# Patient Record
Sex: Female | Born: 1995 | Hispanic: Yes | Marital: Single | Smoking: Former smoker
Health system: Southern US, Community
[De-identification: ages and names within clinical notes are randomized; demographics above are authoritative.]

## PROBLEM LIST (undated history)

## (undated) DIAGNOSIS — E7800 Pure hypercholesterolemia, unspecified: Secondary | ICD-10-CM

## (undated) DIAGNOSIS — N920 Excessive and frequent menstruation with regular cycle: Secondary | ICD-10-CM

## (undated) HISTORY — DX: Excessive and frequent menstruation with regular cycle: N92.0

## (undated) HISTORY — DX: Pure hypercholesterolemia, unspecified: E78.00

## (undated) HISTORY — PX: LAPAROSCOPIC GASTRIC BAND REMOVAL WITH LAPAROSCOPIC GASTRIC SLEEVE RESECTION: SHX6498

---

## 2017-06-27 LAB — HM PAP SMEAR: HM Pap smear: NEGATIVE

## 2017-06-27 LAB — HM HIV SCREENING LAB: HM HIV Screening: NEGATIVE

## 2018-06-13 ENCOUNTER — Ambulatory Visit: Payer: Self-pay | Admitting: Obstetrics and Gynecology

## 2018-06-13 NOTE — Progress Notes (Deleted)
   PCP:  No primary care provider on file.   No chief complaint on file.    HPI:      Gwendolyn Rogers is a 23 y.o. No obstetric history on file. who LMP was No LMP recorded., presents today for her NP annual examination.  Her menses are {norm/abn:715}, lasting {number:22536} days.  Dysmenorrhea {dysmen:716}. She {does:18564} have intermenstrual bleeding.  Sex activity: {sex active:315163}.  Last Pap: {ZOXW:[id number]}{date:[id number]}  Results were: {norm/abn:16707::"no abnormalities"} /neg HPV DNA *** Hx of STDs: {STD hx:14358}  There is no FH of breast cancer. There is no FH of ovarian cancer. The patient {does:18564} do self-breast exams.  Tobacco use: {tob:20664} Alcohol use: {Alcohol:11675} No drug use.  Exercise: {exercise:31265}  She {does:18564} get adequate calcium and Vitamin D in her diet.   No past medical history on file.  *** The histories are not reviewed yet. Please review them in the "History" navigator section and refresh this SmartLink.  No family history on file.  Social History   Socioeconomic History  . Marital status: Single    Spouse name: Not on file  . Number of children: Not on file  . Years of education: Not on file  . Highest education level: Not on file  Occupational History  . Not on file  Social Needs  . Financial resource strain: Not on file  . Food insecurity:    Worry: Not on file    Inability: Not on file  . Transportation needs:    Medical: Not on file    Non-medical: Not on file  Tobacco Use  . Smoking status: Not on file  Substance and Sexual Activity  . Alcohol use: Not on file  . Drug use: Not on file  . Sexual activity: Not on file  Lifestyle  . Physical activity:    Days per week: Not on file    Minutes per session: Not on file  . Stress: Not on file  Relationships  . Social connections:    Talks on phone: Not on file    Gets together: Not on file    Attends religious service: Not on file    Active member of club or  organization: Not on file    Attends meetings of clubs or organizations: Not on file    Relationship status: Not on file  . Intimate partner violence:    Fear of current or ex partner: Not on file    Emotionally abused: Not on file    Physically abused: Not on file    Forced sexual activity: Not on file  Other Topics Concern  . Not on file  Social History Narrative  . Not on file    No outpatient medications prior to visit.   No facility-administered medications prior to visit.       ROS:  Review of Systems BREAST: No symptoms   Objective: There were no vitals taken for this visit.   OBGyn Exam  Results: No results found for this or any previous visit (from the past 24 hour(s)).  Assessment/Plan: No diagnosis found.  No orders of the defined types were placed in this encounter.            GYN counsel {counseling:16159}     F/U  No follow-ups on file.  Alicia B. Copland, PA-C 06/13/2018 9:03 AM

## 2018-08-03 ENCOUNTER — Ambulatory Visit (INDEPENDENT_AMBULATORY_CARE_PROVIDER_SITE_OTHER): Payer: BLUE CROSS/BLUE SHIELD | Admitting: Certified Nurse Midwife

## 2018-08-03 ENCOUNTER — Other Ambulatory Visit (HOSPITAL_COMMUNITY)
Admission: RE | Admit: 2018-08-03 | Discharge: 2018-08-03 | Disposition: A | Discharge status: Home / Self Care | Payer: Self-pay | Source: Ambulatory Visit | Attending: Certified Nurse Midwife | Admitting: Certified Nurse Midwife

## 2018-08-03 ENCOUNTER — Encounter: Payer: Self-pay | Admitting: Certified Nurse Midwife

## 2018-08-03 VITALS — BP 94/65 | HR 72 | Ht 61.0 in | Wt 166.0 lb

## 2018-08-03 DIAGNOSIS — Z01419 Encounter for gynecological examination (general) (routine) without abnormal findings: Secondary | ICD-10-CM | POA: Insufficient documentation

## 2018-08-03 DIAGNOSIS — Z6831 Body mass index (BMI) 31.0-31.9, adult: Secondary | ICD-10-CM

## 2018-08-03 DIAGNOSIS — Z113 Encounter for screening for infections with a predominantly sexual mode of transmission: Secondary | ICD-10-CM | POA: Insufficient documentation

## 2018-08-03 DIAGNOSIS — N920 Excessive and frequent menstruation with regular cycle: Secondary | ICD-10-CM

## 2018-08-03 DIAGNOSIS — O26899 Other specified pregnancy related conditions, unspecified trimester: Secondary | ICD-10-CM | POA: Insufficient documentation

## 2018-08-03 DIAGNOSIS — R102 Pelvic and perineal pain: Secondary | ICD-10-CM

## 2018-08-03 DIAGNOSIS — Z87440 Personal history of urinary (tract) infections: Secondary | ICD-10-CM

## 2018-08-03 DIAGNOSIS — Z9884 Bariatric surgery status: Secondary | ICD-10-CM

## 2018-08-03 DIAGNOSIS — Z3041 Encounter for surveillance of contraceptive pills: Secondary | ICD-10-CM

## 2018-08-03 NOTE — Patient Instructions (Signed)
WE WOULD LOVE TO HEAR FROM YOU!!!!   Thank you Andria Rhein for visiting [facility].  Providing our patients with the best experience possible is really important to [place], and we hope that you felt that on your recent visit. The most valuable feedback we get comes from [place]!!    If you receive a survey please take a couple of minutes to let us know how we did.Thank you for continuing to trust [place] with your care.   [facility]   Ethinyl Estradiol; Norgestimate tablets What is this medicine? ETHINYL ESTRADIOL; NORGESTIMATE (ETH in il es tra DYE ole; nor JES ti mate) is an oral contraceptive. The products combine two types of female hormones, an estrogen and a progestin. They are used to prevent ovulation and pregnancy. Some products are also used to treat acne in females. This medicine may be used for other purposes; ask your health care provider or pharmacist if you have questions. COMMON BRAND NAME(S): Estarylla, Mili, MONO-LINYAH, MonoNessa, Norgestimate/Ethinyl Estradiol, Ortho Tri-Cyclen, Ortho Tri-Cyclen Lo, Ortho-Cyclen, Previfem, Sprintec, Tri-Estarylla, TRI-LINYAH, Tri-Lo-Estarylla, Tri-Lo-Marzia, Tri-Lo-Mili, Tri-Lo-Sprintec, Tri-Mili, Tri-Previfem, Tri-Sprintec, Tri-VyLibra, Trinessa, Trinessa Lo, VyLibra What should I tell my health care provider before I take this medicine? They need to know if you have or ever had any of these conditions: -abnormal vaginal bleeding -blood vessel disease or blood clots -breast, cervical, endometrial, ovarian, liver, or uterine cancer -diabetes -gallbladder disease -heart disease or recent heart attack -high blood pressure -high cholesterol -kidney disease -liver disease -migraine headaches -stroke -systemic lupus erythematosus (SLE) -tobacco smoker -an unusual or allergic reaction to estrogens, progestins, other medicines, foods, dyes, or preservatives -pregnant or trying to get  pregnant -breast-feeding How should I use this medicine? Take this medicine by mouth. To reduce nausea, this medicine may be taken with food. Follow the directions on the prescription label. Take this medicine at the same time each day and in the order directed on the package. Do not take your medicine more often than directed. Contact your pediatrician regarding the use of this medicine in children. Special care may be needed. This medicine has been used in female children who have started having menstrual periods. A patient package insert for the product will be given with each prescription and refill. Read this sheet carefully each time. The sheet may change frequently. Overdosage: If you think you have taken too much of this medicine contact a poison control center or emergency room at once. NOTE: This medicine is only for you. Do not share this medicine with others. What if I miss a dose? If you miss a dose, refer to the patient information sheet you received with your medicine for direction. If you miss more than one pill, this medicine may not be as effective and you may need to use another form of birth control. What may interact with this medicine? Do not take this medicine with the following medication: -dasabuvir; ombitasvir; paritaprevir; ritonavir -ombitasvir; paritaprevir; ritonavir This medicine may also interact with the following medications: -acetaminophen -antibiotics or medicines for infections, especially rifampin, rifabutin, rifapentine, and griseofulvin, and possibly penicillins or tetracyclines -aprepitant -ascorbic acid (vitamin C) -atorvastatin -barbiturate medicines, such as phenobarbital -bosentan -carbamazepine -caffeine -clofibrate -cyclosporine -dantrolene -doxercalciferol -felbamate -grapefruit juice -hydrocortisone -medicines for anxiety or sleeping problems, such as diazepam or temazepam -medicines for diabetes, including pioglitazone -mineral  oil -modafinil -mycophenolate -nefazodone -oxcarbazepine -phenytoin -prednisolone -ritonavir or other medicines for HIV infection or AIDS -rosuvastatin -selegiline -soy isoflavones supplements -St. John's wort -tamoxifen or raloxifene -theophylline -  thyroid hormones -topiramate -warfarin This list may not describe all possible interactions. Give your health care provider a list of all the medicines, herbs, non-prescription drugs, or dietary supplements you use. Also tell them if you smoke, drink alcohol, or use illegal drugs. Some items may interact with your medicine. What should I watch for while using this medicine? Visit your doctor or health care professional for regular checks on your progress. You will need a regular breast and pelvic exam and Pap smear while on this medicine. You should also discuss the need for regular mammograms with your health care professional, and follow his or her guidelines for these tests. This medicine can make your body retain fluid, making your fingers, hands, or ankles swell. Your blood pressure can go up. Contact your doctor or health care professional if you feel you are retaining fluid. Use an additional method of contraception during the first cycle that you take these tablets. If you have any reason to think you are pregnant, stop taking this medicine right away and contact your doctor or health care professional. If you are taking this medicine for hormone related problems, it may take several cycles of use to see improvement in your condition. Do not use this product if you smoke and are over 27 years of age. Smoking increases the risk of getting a blood clot or having a stroke while you are taking birth control pills, especially if you are more than 23 years old. If you are a smoker who is 56 years of age or younger, you are strongly advised not to smoke while taking birth control pills. This medicine can make you more sensitive to the sun. Keep  out of the sun. If you cannot avoid being in the sun, wear protective clothing and use sunscreen. Do not use sun lamps or tanning beds/booths. If you wear contact lenses and notice visual changes, or if the lenses begin to feel uncomfortable, consult your eye care specialist. In some women, tenderness, swelling, or minor bleeding of the gums may occur. Notify your dentist if this happens. Brushing and flossing your teeth regularly may help limit this. See your dentist regularly and inform your dentist of the medicines you are taking. If you are going to have elective surgery, you may need to stop taking this medicine before the surgery. Consult your health care professional for advice. This medicine does not protect you against HIV infection (AIDS) or any other sexually transmitted diseases. What side effects may I notice from receiving this medicine? Side effects that you should report to your doctor or health care professional as soon as possible: -breast tissue changes or discharge -changes in vaginal bleeding during your period or between your periods -chest pain -coughing up blood -dizziness or fainting spells -headaches or migraines -leg, arm or groin pain -severe or sudden headaches -stomach pain (severe) -sudden shortness of breath -sudden loss of coordination, especially on one side of the body -speech problems -symptoms of vaginal infection like itching, irritation or unusual discharge -tenderness in the upper abdomen -vomiting -weakness or numbness in the arms or legs, especially on one side of the body -yellowing of the eyes or skin Side effects that usually do not require medical attention (report to your doctor or health care professional if they continue or are bothersome): -breakthrough bleeding and spotting that continues beyond the 3 initial cycles of pills -breast tenderness -mood changes, anxiety, depression, frustration, anger, or emotional outbursts -increased  sensitivity to sun or ultraviolet light -nausea -skin  rash, acne, or brown spots on the skin -weight gain (slight) This list may not describe all possible side effects. Call your doctor for medical advice about side effects. You may report side effects to FDA at 1-800-FDA-1088. Where should I keep my medicine? Keep out of the reach of children. Store at room temperature between 15 and 30 degrees C (59 and 86 degrees F). Throw away any unused medicine after the expiration date. NOTE: This sheet is a summary. It may not cover all possible information. If you have questions about this medicine, talk to your doctor, pharmacist, or health care provider.  2019 Elsevier/Gold Standard (2016-01-18 08:09:09)   Menorrhagia Menorrhagia is when your menstrual periods are heavy or last longer than normal. Follow these instructions at home: Medicines   Take over-the-counter and prescription medicines exactly as told by your doctor. This includes iron pills.  Do not change or switch medicines without asking your doctor.  Do not take aspirin or medicines that contain aspirin 1 week before or during your period. Aspirin may make bleeding worse. General instructions  If you need to change your pad or tampon more than once every 2 hours, limit your activity until the bleeding stops.  Iron pills can cause problems when pooping (constipation). To prevent or treat pooping problems while taking prescription iron pills, your doctor may suggest that you: ? Drink enough fluid to keep your pee (urine) clear or pale yellow. ? Take over-the-counter or prescription medicines. ? Eat foods that are high in fiber. These foods include: ? Fresh fruits and vegetables. ? Whole grains. ? Beans. ? Limit foods that are high in fat and processed sugars. This includes fried and sweet foods.  Eat healthy meals and foods that are high in iron. Foods that have a lot of iron include: ? Leafy green  vegetables. ? Meat. ? Liver. ? Eggs. ? Whole grain breads and cereals.  Do not try to lose weight until your heavy bleeding has stopped and you have normal amounts of iron in your blood. If you need to lose weight, work with your doctor.  Keep all follow-up visits as told by your doctor. This is important. Contact a doctor if:  You soak through a pad or tampon every 1 or 2 hours, and this happens every time you have a period.  You need to use pads and tampons at the same time because you are bleeding so much.  You are taking medicine and you: ? Feel sick to your stomach (nauseous). ? Throw up (vomit). ? Have watery poop (diarrhea).  You have other problems that may be related to the medicine you are taking. Get help right away if:  You soak through more than a pad or tampon in 1 hour.  You pass clots bigger than 1 inch (2.5 cm) wide.  You feel short of breath.  You feel like your heart is beating too fast.  You feel dizzy or you pass out (faint).  You feel very weak or tired. Summary  Menorrhagia is when your menstrual periods are heavy or last longer than normal.  Take over-the-counter and prescription medicines exactly as told by your doctor. This includes iron pills.  Contact a doctor if you soak through more than a pad or tampon in 1 hour or are passing large clots. This information is not intended to replace advice given to you by your health care provider. Make sure you discuss any questions you have with your health care provider. Document Released: 02/16/2008  Document Revised: 05/30/2016 Document Reviewed: 05/30/2016 Elsevier Interactive Patient Education  2019 [place] 18-39 Years, Female Preventive care refers to lifestyle choices and visits with your health care provider that can promote health and wellness. What does preventive care include?   A yearly physical exam. This is also called an annual well check.  Dental exams once  or twice a year.  Routine eye exams. Ask your health care provider how often you should have your eyes checked.  Personal lifestyle choices, including: ? Daily care of your teeth and gums. ? Regular physical activity. ? Eating a healthy diet. ? Avoiding tobacco and drug use. ? Limiting alcohol use. ? Practicing safe sex. ? Taking vitamin and mineral supplements as recommended by your health care provider. What happens during an annual well check? The services and screenings done by your health care provider during your annual well check will depend on your age, overall health, lifestyle risk factors, and family history of disease. Counseling Your health care provider may ask you questions about your:  Alcohol use.  Tobacco use.  Drug use.  Emotional well-being.  Home and relationship well-being.  Sexual activity.  Eating habits.  Work and work Statistician.  Method of birth control.  Menstrual cycle.  Pregnancy history. Screening You may have the following tests or measurements:  Height, weight, and BMI.  Diabetes screening. This is done by checking your blood sugar (glucose) after you have not eaten for a while (fasting).  Blood pressure.  Lipid and cholesterol levels. These may be checked every 5 years starting at age 4.  Skin check.  Hepatitis C blood test.  Hepatitis B blood test.  Sexually transmitted disease (STD) testing.  BRCA-related cancer screening. This may be done if you have a family history of breast, ovarian, tubal, or peritoneal cancers.  Pelvic exam and Pap test. This may be done every 3 years starting at age 65. Starting at age 59, this may be done every 5 years if you have a Pap test in combination with an HPV test. Discuss your test results, treatment options, and if necessary, the need for more tests with your health care provider. Vaccines Your health care provider may recommend certain vaccines, such as:  Influenza vaccine. This  is recommended every year.  Tetanus, diphtheria, and acellular pertussis (Tdap, Td) vaccine. You may need a Td booster every 10 years.  Varicella vaccine. You may need this if you have not been vaccinated.  HPV vaccine. If you are 92 or younger, you may need three doses over 6 months.  Measles, mumps, and rubella (MMR) vaccine. You may need at least one dose of MMR. You may also need a second dose.  Pneumococcal 13-valent conjugate (PCV13) vaccine. You may need this if you have certain conditions and were not previously vaccinated.  Pneumococcal polysaccharide (PPSV23) vaccine. You may need one or two doses if you smoke cigarettes or if you have certain conditions.  Meningococcal vaccine. One dose is recommended if you are age 28-21 years and a first-year college student living in a residence hall, or if you have one of several medical conditions. You may also need additional booster doses.  Hepatitis A vaccine. You may need this if you have certain conditions or if you travel or work in places where you may be exposed to hepatitis A.  Hepatitis B vaccine. You may need this if you have certain conditions or if you travel or work in places where you may be  exposed to hepatitis B.  Haemophilus influenzae type b (Hib) vaccine. You may need this if you have certain risk factors. Talk to your health care provider about which screenings and vaccines you need and how often you need them. This information is not intended to replace advice given to you by your health care provider. Make sure you discuss any questions you have with your health care provider. Document Released: 07/05/2001 Document Revised: 12/20/2016 Document Reviewed: 03/10/2015 Elsevier Interactive Patient Education  2019 [employer].

## 2018-08-03 NOTE — Progress Notes (Signed)
ANNUAL PREVENTATIVE CARE GYN  ENCOUNTER NOTE  Subjective:       Gwendolyn Rogers is a 23 y.o. G0P0000 female here for a routine annual gynecologic exam.  Current complaints: 1. Heavy periods 2. Pelvic pain 3. Requests STI testing 4. Needs refill of OCP  Reports pelvic pain, lower abdominal cramping, bloating and increased vaginal discharge for the last few months. Previously diagnosed with UTI, but never completed treatment. LBM: this morning.   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    Denies difficulty breathing or respiratory distress, chest pain, abdominal pain, excessive vaginal bleeding, dysuria, and leg pain or swelling.   Gynecologic History  Patient's last menstrual period was 07/17/2018. Period Cycle (Days): 28 Period Duration (Days): Seven (7) Period Pattern: Regular Menstrual Flow: Heavy Menstrual Control: Maxi pad, Tampon Menstrual Control Change Freq (Hours): Two (2) to three (3)  Dysmenorrhea: None  Contraception: OCP (estrogen/progesterone)  Last Pap: 06/2017. Results were: normal per patient  Obstetric History  OB History  Gravida Para Term Preterm AB Living  0 0 0 0 0 0  SAB TAB Ectopic Multiple Live Births  0 0 0 0 0    Past Medical History:  Diagnosis Date  . Heavy periods     Past Surgical History:  Procedure Laterality Date  . LAPAROSCOPIC GASTRIC BAND REMOVAL WITH LAPAROSCOPIC GASTRIC SLEEVE RESECTION      Current Outpatient Medications on File Prior to Visit  Medication Sig Dispense Refill  . norgestimate-ethinyl estradiol (ORTHO-CYCLEN,SPRINTEC,PREVIFEM) 0.25-35 MG-MCG tablet Take 1 tablet by mouth daily.     No current facility-administered medications on file prior to visit.     No Known Allergies  Social History   Socioeconomic History  . Marital status: Single    Spouse name: Not on file  . Number of children: Not on file  . Years of education: Not on file  .  Highest education level: Not on file  Occupational History  . Not on file  Social Needs  . Financial resource strain: Not on file  . Food insecurity:    Worry: Not on file    Inability: Not on file  . Transportation needs:    Medical: Not on file    Non-medical: Not on file  Tobacco Use  . Smoking status: Light Tobacco Smoker  . Smokeless tobacco: Never Used  Substance and Sexual Activity  . Alcohol use: Yes  . Drug use: Never  . Sexual activity: Yes    Birth control/protection: Pill  Lifestyle  . Physical activity:    Days per week: Not on file    Minutes per session: Not on file  . Stress: Not on file  Relationships  . Social connections:    Talks on phone: Not on file    Gets together: Not on file    Attends religious service: Not on file    Active member of club or organization: Not on file    Attends meetings of clubs or organizations: Not on file    Relationship status: Not on file  . Intimate partner violence:    Fear of current or ex partner: Not on file    Emotionally abused: Not on file    Physically abused: Not on file    Forced sexual activity: Not on file  Other Topics Concern  . Not on file  Social History Narrative  . Not on file    History reviewed. No pertinent family history.  The following portions  of the patient's history were reviewed and updated as appropriate: allergies, current medications, past family history, past medical history, past social history, past surgical history and problem list.  Review of Systems  ROS negative except as noted above. Information obtained from patient.    Objective:   BP 94/65   Pulse 72   Ht 5\' 1"  (1.549 m)   Wt 166 lb (75.3 kg)   LMP 07/17/2018   BMI 31.37 kg/m    CONSTITUTIONAL: Well-developed, well-nourished female in no acute distress.   PSYCHIATRIC: Normal mood and affect. Normal behavior. Normal judgment and thought content.  NEUROLGIC: Alert and oriented to person, place, and time. Normal  muscle tone coordination. No cranial nerve deficit noted.  HENT:  Normocephalic, atraumatic, External right and left ear normal.   EYES: Conjunctivae and EOM are normal. Pupils are equal and round.   NECK: Normal range of motion, supple, no masses.  Normal thyroid.   SKIN: Skin is warm and dry. No rash noted. Not diaphoretic. No erythema. No pallor.  CARDIOVASCULAR: Normal heart rate noted, regular rhythm, no murmur.  RESPIRATORY: Clear to auscultation bilaterally. Effort and breath sounds normal, no problems with respiration noted.  BREASTS: Symmetric in size. No masses, skin changes, nipple drainage, or lymphadenopathy.  ABDOMEN: Soft, normal bowel sounds, no distention noted.  No tenderness, rebound or guarding. Obese.   PELVIC:  External Genitalia: Normal  Vagina: Normal  Cervix: Normal  Uterus: Normal  Adnexa: Normal  MUSCULOSKELETAL: Normal range of motion. No tenderness.  No cyanosis, clubbing, or edema.  2+ distal pulses.  LYMPHATIC: No Axillary, Supraclavicular, or Inguinal Adenopathy.  Assessment:   Annual gynecologic examination 23 y.o.   Contraception: OCP (estrogen/progesterone)   Obesity 1   Problem List Items Addressed This Visit    None    Visit Diagnoses    Well woman exam    -  Primary   Relevant Orders   Urine Culture   CBC   Thyroid Panel With TSH   Comprehensive metabolic panel   Lipid panel   RPR   HIV Antibody (routine testing w rflx)   Hemoglobin A1c   Cervicovaginal ancillary only   Menorrhagia with regular cycle       Relevant Orders   Thyroid Panel With TSH   Hx of bariatric surgery       Relevant Orders   Thyroid Panel With TSH   History of UTI       Relevant Orders   Urine Culture   Pelvic pain       Relevant Orders   Urine Culture   Cervicovaginal ancillary only   BMI 31.0-31.9,adult       Relevant Orders   Thyroid Panel With TSH   Comprehensive metabolic panel   Lipid panel   Screening for STD (sexually transmitted  disease)       Relevant Orders   RPR   HIV Antibody (routine testing w rflx)   Cervicovaginal ancillary only   Surveillance for birth control, oral contraceptives          Plan:   Pap: Not needed  Labs: See orders   Routine preventative health maintenance measures emphasized: Exercise/Diet/Weight control, Tobacco Warnings, Alcohol/Substance use risks, Stress Management and Peer Pressure Issues; see AVS  Rx: Sprintec, see orders  Reviewed red flag symptoms and when to call  RTC x 1 year for ANNUAL EXAM or sooner if needed   Gunnar Bulla, CNM [facility], [facility]

## 2018-08-04 LAB — CBC
Hematocrit: 35 % (ref 34.0–46.6)
Hemoglobin: 12 g/dL (ref 11.1–15.9)
MCH: 29.6 pg (ref 26.6–33.0)
MCV: 86 fL (ref 79–97)
Platelets: 316 10*3/uL (ref 150–450)
RBC: 4.05 x10E6/uL (ref 3.77–5.28)
RDW: 11.9 % (ref 11.7–15.4)
WBC: 8.1 10*3/uL (ref 3.4–10.8)

## 2018-08-04 LAB — COMPREHENSIVE METABOLIC PANEL
ALT: 9 IU/L (ref 0–32)
AST: 13 IU/L (ref 0–40)
Albumin/Globulin Ratio: 1.4 (ref 1.2–2.2)
Albumin: 4 g/dL (ref 3.9–5.0)
BUN/Creatinine Ratio: 15 (ref 9–23)
BUN: 10 mg/dL (ref 6–20)
Bilirubin Total: 0.3 mg/dL (ref 0.0–1.2)
CO2: 24 mmol/L (ref 20–29)
Calcium: 9.4 mg/dL (ref 8.7–10.2)
Creatinine, Ser: 0.66 mg/dL (ref 0.57–1.00)
GFR calc Af Amer: 145 mL/min/{1.73_m2} (ref >59)
GFR calc non Af Amer: 126 mL/min/{1.73_m2} (ref >59)
Globulin, Total: 2.8 g/dL (ref 1.5–4.5)
Glucose: 70 mg/dL (ref 65–99)
Potassium: 4.4 mmol/L (ref 3.5–5.2)
Sodium: 141 mmol/L (ref 134–144)
Total Protein: 6.8 g/dL (ref 6.0–8.5)

## 2018-08-04 LAB — RPR: RPR Ser Ql: NONREACTIVE

## 2018-08-04 LAB — LIPID PANEL
Chol/HDL Ratio: 3.7 ratio (ref 0.0–4.4)
Cholesterol, Total: 173 mg/dL (ref 100–199)
HDL: 47 mg/dL (ref >39)
LDL Calculated: 89 mg/dL (ref 0–99)
Triglycerides: 184 mg/dL — ABNORMAL HIGH (ref 0–149)
VLDL Cholesterol Cal: 37 mg/dL (ref 5–40)

## 2018-08-04 LAB — THYROID PANEL WITH TSH
T4, Total: 9.5 ug/dL (ref 4.5–12.0)
TSH: 4.09 u[IU]/mL (ref 0.450–4.500)

## 2018-08-04 LAB — HEMOGLOBIN A1C: Est. average glucose Bld gHb Est-mCnc: 97 mg/dL

## 2018-08-04 LAB — HIV ANTIBODY (ROUTINE TESTING W REFLEX): HIV Screen 4th Generation wRfx: NONREACTIVE

## 2018-08-05 LAB — URINE CULTURE: Organism ID, Bacteria: NO GROWTH

## 2018-08-06 MED ORDER — NORGESTIMATE-ETH ESTRADIOL 0.25-35 MG-MCG PO TABS: 1.0000 | ORAL_TABLET | Freq: Every day | ORAL | 11 refills | Status: DC

## 2018-08-08 LAB — CERVICOVAGINAL ANCILLARY ONLY
Bacterial vaginitis: NEGATIVE
Chlamydia: NEGATIVE
Neisseria Gonorrhea: NEGATIVE
Trichomonas: NEGATIVE

## 2018-08-10 ENCOUNTER — Telehealth: Payer: Self-pay

## 2018-08-10 NOTE — Telephone Encounter (Signed)
Informed patient of test results per ML.

## 2018-08-13 ENCOUNTER — Telehealth: Payer: Self-pay

## 2018-08-13 NOTE — Telephone Encounter (Signed)
Informed pt of neg vag swab per ML.

## 2018-08-13 NOTE — Progress Notes (Signed)
Please contact. Vaginal swab negative for infection. Encourage to activate MyChart. Thanks, JML

## 2019-04-08 ENCOUNTER — Telehealth: Payer: Self-pay | Admitting: Family Medicine

## 2019-04-08 NOTE — Telephone Encounter (Addendum)
Returned patient phone call. Patient requesting an appt to have a Nexplanon placed. Patient states she has had a Nexplanon before but had it removed to start OCP's but cannot remember to take them. Patient states last PE and Pap Smear was 06/2018 at [facility]. Patient states last LMP was 04/04/2019. Patient states last sex without a condom was this weekend. Nexplanon consult completed. Counseled patient to abstain from sex for the next two weeks. Nexplanon appt scheduled for 04/22/2019 @ 8:20. Informed patient that she will probably have a PT at this appt. Counseled to arrive at 8:00 for check in. Patient verbalizes understanding of above. Hal Morales, RN

## 2019-04-08 NOTE — Telephone Encounter (Signed)
Patient want nexplanon

## 2019-04-22 ENCOUNTER — Encounter: Payer: Self-pay | Admitting: Family Medicine

## 2019-04-22 ENCOUNTER — Ambulatory Visit (LOCAL_COMMUNITY_HEALTH_CENTER): Payer: Self-pay | Admitting: Family Medicine

## 2019-04-22 ENCOUNTER — Other Ambulatory Visit: Payer: Self-pay

## 2019-04-22 VITALS — BP 101/63 | Ht 62.0 in | Wt 165.0 lb

## 2019-04-22 DIAGNOSIS — Z3009 Encounter for other general counseling and advice on contraception: Secondary | ICD-10-CM

## 2019-04-22 DIAGNOSIS — Z30017 Encounter for initial prescription of implantable subdermal contraceptive: Secondary | ICD-10-CM

## 2019-04-22 DIAGNOSIS — Z3200 Encounter for pregnancy test, result unknown: Secondary | ICD-10-CM

## 2019-04-22 LAB — PREGNANCY, URINE: Preg Test, Ur: NEGATIVE

## 2019-04-22 MED ORDER — ETONOGESTREL 68 MG ~~LOC~~ IMPL
68.0000 mg | DRUG_IMPLANT | Freq: Once | SUBCUTANEOUS | Status: AC
  Administered 2019-04-22: 12:00:00 68 mg via SUBCUTANEOUS

## 2019-04-22 NOTE — Progress Notes (Signed)
Pt here for Nexplanon insertion. Pt reports was on OCP's. Pt reports last OCP was 03/23/2019. Pt reports that she couldn't remember to take OCP's. UPT negative today. RN counseling for Nexplanon insertion completed and consent forms signed by pt and questions answered.Ronny Bacon, RN

## 2019-04-22 NOTE — Progress Notes (Signed)
Nexplanon Insertion Procedure Pregnancy test negative. Patient identified, informed consent performed, consent signed.   Patient does understand that irregular bleeding is a very common side effect of this medication. She was advised to have backup contraception after placement. Patient was determined to meet WHO criteria for not being pregnant. Appropriate time out taken.  The insertion site was identified 8-10 cm (3-4 inches) from the medial epicondyle of the humerus and 3-5 cm (1.25-2 inches) posterior to (below) the sulcus (groove) between the biceps and triceps muscles of the patient's Left arm and marked.  Patient was prepped with alcohol swab and then injected with 3 ml of 1% lidocaine.  Arm was prepped with chlorhexidene, Nexplanon removed from packaging,  Device confirmed in needle, then inserted full length of needle and withdrawn per handbook instructions. Nexplanon was able to palpated in the patient's arm; patient palpated the insert herself. There was minimal blood loss.  Patient insertion site covered with guaze and a pressure bandage to reduce any bruising.  The patient tolerated the procedure well and was given post procedure instructions.  Nexplanon:   Counseled patient to take OTC analgesic starting as soon as lidocaine starts to wear off and take regularly for at least 48 hr to decrease discomfort.  Specifically to take with food or milk to decrease stomach upset and for IB 600 mg (3 tablets) every 6 hrs; IB 800 mg (4 tablets) every 8 hrs; or Aleve 2 tablets every 12 hrs. Co. Client to use back-up x 1-2 weeks after insertion.   Co client that last PE was done 06/2017.  Pap was NILM, NIL.  Co. To call client in after 05/24/2019 for annual exam.

## 2019-04-23 ENCOUNTER — Encounter: Payer: Self-pay | Admitting: Family Medicine

## 2019-05-10 NOTE — Addendum Note (Signed)
Addended by: Cletis Media on: 05/10/2019 12:03 PM   Modules accepted: Orders

## 2019-08-05 ENCOUNTER — Other Ambulatory Visit: Payer: Self-pay

## 2019-08-05 ENCOUNTER — Encounter: Payer: Self-pay | Admitting: Certified Nurse Midwife

## 2019-08-05 ENCOUNTER — Ambulatory Visit (INDEPENDENT_AMBULATORY_CARE_PROVIDER_SITE_OTHER): Payer: BC Managed Care – PPO | Admitting: Certified Nurse Midwife

## 2019-08-05 VITALS — BP 88/56 | HR 72 | Ht 62.0 in | Wt 169.4 lb

## 2019-08-05 DIAGNOSIS — Z3046 Encounter for surveillance of implantable subdermal contraceptive: Secondary | ICD-10-CM | POA: Diagnosis not present

## 2019-08-05 DIAGNOSIS — Z01419 Encounter for gynecological examination (general) (routine) without abnormal findings: Secondary | ICD-10-CM | POA: Diagnosis not present

## 2019-08-05 NOTE — Progress Notes (Signed)
ANNUAL PREVENTATIVE CARE GYN  ENCOUNTER NOTE  Subjective:       Gwendolyn Rogers is a 24 y.o. No obstetric history on file. female here for a routine annual gynecologic exam.  Current complaints: 1. Desires Nexplanon removal due to constant, heavy bleeding  Denies difficulty breathing or respiratory distress, chest pain, abdominal pain, dysuria, and leg pain or swelling.    Gynecologic History  No LMP recorded. Patient has had an implant.  Contraception: Nexplanon  Last Pap: 06/2017. Results were: normal per patient  Obstetric History  OB History  Gravida Para Term Preterm AB Living  0 0 0 0 0 0  SAB TAB Ectopic Multiple Live Births  0 0 0 0 0    Past Medical History:  Diagnosis Date  . Heavy periods     Past Surgical History:  Procedure Laterality Date  . LAPAROSCOPIC GASTRIC BAND REMOVAL WITH LAPAROSCOPIC GASTRIC SLEEVE RESECTION      Current Outpatient Medications on File Prior to Visit  Medication Sig Dispense Refill  . etonogestrel (NEXPLANON) 68 MG IMPL implant 1 each by Subdermal route once.    . norgestimate-ethinyl estradiol (ORTHO-CYCLEN,SPRINTEC,PREVIFEM) 0.25-35 MG-MCG tablet Take 1 tablet by mouth daily. (Patient not taking: Reported on 08/05/2019) 1 Package 11   No current facility-administered medications on file prior to visit.    No Known Allergies  Social History   Socioeconomic History  . Marital status: Single    Spouse name: Not on file  . Number of children: Not on file  . Years of education: Not on file  . Highest education level: Not on file  Occupational History  . Not on file  Tobacco Use  . Smoking status: Current Every Day Smoker    Types: E-cigarettes    Start date: 08/20/2018  . Smokeless tobacco: Never Used  . Tobacco comment: vapes every day  Substance and Sexual Activity  . Alcohol use: Yes  . Drug use: Never  . Sexual activity: Yes    Birth control/protection: Nexplanon  Other Topics Concern  . Not on file  Social  History Narrative   ** Merged History Encounter **       Social Determinants of Health   Financial Resource Strain:   . Difficulty of Paying Living Expenses:   Food Insecurity:   . Worried About Charity fundraiser in the Last Year:   . Arboriculturist in the Last Year:   Transportation Needs:   . Film/video editor (Medical):   Marland Kitchen Lack of Transportation (Non-Medical):   Physical Activity:   . Days of Exercise per Week:   . Minutes of Exercise per Session:   Stress:   . Feeling of Stress :   Social Connections:   . Frequency of Communication with Friends and Family:   . Frequency of Social Gatherings with Friends and Family:   . Attends Religious Services:   . Active Member of Clubs or Organizations:   . Attends Archivist Meetings:   Marland Kitchen Marital Status:   Intimate Partner Violence:   . Fear of Current or Ex-Partner:   . Emotionally Abused:   Marland Kitchen Physically Abused:   . Sexually Abused:     History reviewed. No pertinent family history.  The following portions of the patient's history were reviewed and updated as appropriate: allergies, current medications, past family history, past medical history, past social history, past surgical history and problem list.  Review of Systems  ROS negative except as noted above. Information  obtained from patient.    Objective:   BP (!) 88/56   Pulse 72   Ht 5\' 2"  (1.575 m)   Wt 169 lb 6 oz (76.8 kg)   BMI 30.98 kg/m    CONSTITUTIONAL: Well-developed, well-nourished female in no acute distress.   PSYCHIATRIC: Normal mood and affect. Normal behavior. Normal judgment and thought content.  NEUROLGIC: Alert and oriented to person, place, and time. Normal muscle tone coordination. No cranial nerve deficit noted.  HENT:  Normocephalic, atraumatic, External right and left ear normal.   EYES: Conjunctivae and EOM are normal. Pupils are equal and round.   NECK: Normal range of motion, supple, no masses.  Normal thyroid.    SKIN: Skin is warm and dry. No rash noted. Not diaphoretic. No erythema. No pallor. Nexplanon present in left arm. Professional tattoos present.    CARDIOVASCULAR: Normal heart rate noted, regular rhythm, no murmur.  RESPIRATORY: Clear to auscultation bilaterally. Effort and breath sounds normal, no problems with respiration noted.  BREASTS: Symmetric in size. No masses, skin changes, nipple drainage, or lymphadenopathy.  ABDOMEN: Soft, normal bowel sounds, no distention noted.  No tenderness, rebound or guarding.   PELVIC:  External Genitalia: Normal  Vagina: Normal  Cervix: Normal  Uterus: Normal  Adnexa: Normal   MUSCULOSKELETAL: Normal range of motion. No tenderness.  No cyanosis, clubbing, or edema.  2+ distal pulses.  LYMPHATIC: No Axillary, Supraclavicular, or Inguinal Adenopathy.  Assessment:   Annual gynecologic examination 24 y.o.   Contraception: Nexplanon, desires removal; see note below   Obesity 1   Problem List Items Addressed This Visit    None    Visit Diagnoses    Well woman exam    -  Primary   Encounter for Nexplanon removal          Plan:   Pap: Not needed  Labs: Declined   Routine preventative health maintenance measures emphasized: Exercise/Diet/Weight control, Tobacco Warnings, Alcohol/Substance use risks and Stress Management; see AVS  Nexplanon removed, see note below; declines other form of contraception at this time  Reviewed red flag symptoms and when to call  Return to Clinic - 1 Year   30, CNM [facility], [facility] 08/05/19 12:02 PM   Nexplanon Removal Note  Gwendolyn Rogers is a 24 y.o. year old Hispanic female here for Nexplanon removal.  Patient given informed consent for removal of her Nexplanon.  BP (!) 88/56   Pulse 72   Ht 5\' 2"  (1.575 m)   Wt 169 lb 6 oz (76.8 kg)   BMI 30.98 kg/m   Appropriate time out taken. Implanon site identified.  Area prepped in usual sterile fashon.  One cc of 2% lidocaine was used to anesthetize the area at the distal end of the implant. A small stab incision was made right beside the implant on the distal portion.  The Implanon rod was grasped using hemostats and removed without difficulty.  There was less than 3 cc blood loss. There were no complications.  Steri-strips were applied over the small incision and a pressure bandage was applied.  The patient tolerated the procedure well.  She was instructed to keep the area clean and dry, remove pressure bandage in 24 hours, and keep insertion site covered with the steri-strip for 3-5 days.    RTC as needed.    30, CNM [facility], [facility] 08/05/19 12:02 PM

## 2019-08-05 NOTE — Patient Instructions (Signed)
Nexplanon Instructions After Insertion/Removal   Keep bandage clean and dry for 24 hours   May use ice/Tylenol/Ibuprofen for soreness or pain   If you develop fever, drainage or increased warmth from incision site-contact office immediately    Preventive Care 21-24 Years Old, Female Preventive care refers to visits with your health care provider and lifestyle choices that can promote health and wellness. This includes:  A yearly physical exam. This may also be called an annual well check.  Regular dental visits and eye exams.  Immunizations.  Screening for certain conditions.  Healthy lifestyle choices, such as eating a healthy diet, getting regular exercise, not using drugs or products that contain nicotine and tobacco, and limiting alcohol use. What can I expect for my preventive care visit? Physical exam Your health care provider will check your:  Height and weight. This may be used to calculate body mass index (BMI), which tells if you are at a healthy weight.  Heart rate and blood pressure.  Skin for abnormal spots. Counseling Your health care provider may ask you questions about your:  Alcohol, tobacco, and drug use.  Emotional well-being.  Home and relationship well-being.  Sexual activity.  Eating habits.  Work and work environment.  Method of birth control.  Menstrual cycle.  Pregnancy history. What immunizations do I need?  Influenza (flu) vaccine  This is recommended every year. Tetanus, diphtheria, and pertussis (Tdap) vaccine  You may need a Td booster every 10 years. Varicella (chickenpox) vaccine  You may need this if you have not been vaccinated. Human papillomavirus (HPV) vaccine  If recommended by your health care provider, you may need three doses over 6 months. Measles, mumps, and rubella (MMR) vaccine  You may need at least one dose of MMR. You may also need a second dose. Meningococcal conjugate (MenACWY) vaccine  One dose  is recommended if you are age 19-21 years and a first-year college student living in a residence hall, or if you have one of several medical conditions. You may also need additional booster doses. Pneumococcal conjugate (PCV13) vaccine  You may need this if you have certain conditions and were not previously vaccinated. Pneumococcal polysaccharide (PPSV23) vaccine  You may need one or two doses if you smoke cigarettes or if you have certain conditions. Hepatitis A vaccine  You may need this if you have certain conditions or if you travel or work in places where you may be exposed to hepatitis A. Hepatitis B vaccine  You may need this if you have certain conditions or if you travel or work in places where you may be exposed to hepatitis B. Haemophilus influenzae type b (Hib) vaccine  You may need this if you have certain conditions. You may receive vaccines as individual doses or as more than one vaccine together in one shot (combination vaccines). Talk with your health care provider about the risks and benefits of combination vaccines. What tests do I need?  Blood tests  Lipid and cholesterol levels. These may be checked every 5 years starting at age 20.  Hepatitis C test.  Hepatitis B test. Screening  Diabetes screening. This is done by checking your blood sugar (glucose) after you have not eaten for a while (fasting).  Sexually transmitted disease (STD) testing.  BRCA-related cancer screening. This may be done if you have a family history of breast, ovarian, tubal, or peritoneal cancers.  Pelvic exam and Pap test. This may be done every 3 years starting at age 21. Starting at   age 30, this may be done every 5 years if you have a Pap test in combination with an HPV test. Talk with your health care provider about your test results, treatment options, and if necessary, the need for more tests. Follow these instructions at home: Eating and drinking   Eat a diet that includes  fresh fruits and vegetables, whole grains, lean protein, and low-fat dairy.  Take vitamin and mineral supplements as recommended by your health care provider.  Do not drink alcohol if: ? Your health care provider tells you not to drink. ? You are pregnant, may be pregnant, or are planning to become pregnant.  If you drink alcohol: ? Limit how much you have to 0-1 drink a day. ? Be aware of how much alcohol is in your drink. In the [place] one drink equals one 12 oz bottle of beer (355 mL), one 5 oz glass of wine (148 mL), or one 1 oz glass of hard liquor (44 mL). Lifestyle  Take daily care of your teeth and gums.  Stay active. Exercise for at least 30 minutes on 5 or more days each week.  Do not use any products that contain nicotine or tobacco, such as cigarettes, e-cigarettes, and chewing tobacco. If you need help quitting, ask your health care provider.  If you are sexually active, practice safe sex. Use a condom or other form of birth control (contraception) in order to prevent pregnancy and STIs (sexually transmitted infections). If you plan to become pregnant, see your health care provider for a preconception visit. What's next?  Visit your health care provider once a year for a well check visit.  Ask your health care provider how often you should have your eyes and teeth checked.  Stay up to date on all vaccines. This information is not intended to replace advice given to you by your health care provider. Make sure you discuss any questions you have with your health care provider. Document Revised: 01/18/2018 Document Reviewed: 01/18/2018 Elsevier Patient Education  2020 [employer]  

## 2019-12-08 ENCOUNTER — Emergency Department: Payer: BC Managed Care – PPO

## 2019-12-08 ENCOUNTER — Emergency Department
Admission: EM | Admit: 2019-12-08 | Discharge: 2019-12-08 | Disposition: A | Discharge status: Home / Self Care | Payer: BC Managed Care – PPO | Attending: Emergency Medicine | Admitting: Emergency Medicine

## 2019-12-08 ENCOUNTER — Encounter: Payer: Self-pay | Admitting: Emergency Medicine

## 2019-12-08 ENCOUNTER — Other Ambulatory Visit: Payer: Self-pay

## 2019-12-08 DIAGNOSIS — M546 Pain in thoracic spine: Secondary | ICD-10-CM | POA: Diagnosis not present

## 2019-12-08 DIAGNOSIS — M542 Cervicalgia: Secondary | ICD-10-CM | POA: Diagnosis present

## 2019-12-08 DIAGNOSIS — Z793 Long term (current) use of hormonal contraceptives: Secondary | ICD-10-CM | POA: Insufficient documentation

## 2019-12-08 DIAGNOSIS — Y999 Unspecified external cause status: Secondary | ICD-10-CM | POA: Diagnosis not present

## 2019-12-08 DIAGNOSIS — F17210 Nicotine dependence, cigarettes, uncomplicated: Secondary | ICD-10-CM | POA: Insufficient documentation

## 2019-12-08 DIAGNOSIS — Y92410 Unspecified street and highway as the place of occurrence of the external cause: Secondary | ICD-10-CM | POA: Diagnosis not present

## 2019-12-08 DIAGNOSIS — M549 Dorsalgia, unspecified: Secondary | ICD-10-CM

## 2019-12-08 DIAGNOSIS — Y93I9 Activity, other involving external motion: Secondary | ICD-10-CM | POA: Diagnosis not present

## 2019-12-08 LAB — POCT PREGNANCY, URINE: Preg Test, Ur: NEGATIVE

## 2019-12-08 MED ORDER — IBUPROFEN 800 MG PO TABS
800.0000 mg | ORAL_TABLET | Freq: Once | ORAL | Status: AC
  Administered 2019-12-08: 800 mg via ORAL
  Filled 2019-12-08: qty 1

## 2019-12-08 MED ORDER — METHOCARBAMOL 500 MG PO TABS: 500.0000 mg | ORAL_TABLET | Freq: Three times a day (TID) | ORAL | 0 refills | Status: DC | PRN

## 2019-12-08 MED ORDER — IBUPROFEN 600 MG PO TABS: 600.0000 mg | ORAL_TABLET | Freq: Four times a day (QID) | ORAL | 0 refills | Status: DC | PRN
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
## 2020-03-04 ENCOUNTER — Ambulatory Visit (INDEPENDENT_AMBULATORY_CARE_PROVIDER_SITE_OTHER): Payer: BC Managed Care – PPO | Admitting: Certified Nurse Midwife

## 2020-03-04 ENCOUNTER — Other Ambulatory Visit: Payer: Self-pay

## 2020-03-04 DIAGNOSIS — R300 Dysuria: Secondary | ICD-10-CM | POA: Diagnosis not present

## 2020-03-04 LAB — POCT URINALYSIS DIPSTICK
Bilirubin, UA: NEGATIVE
Blood, UA: NEGATIVE
Glucose, UA: NEGATIVE
Ketones, UA: NEGATIVE
Leukocytes, UA: NEGATIVE
Nitrite, UA: NEGATIVE
Protein, UA: NEGATIVE
Spec Grav, UA: 1.02 (ref 1.010–1.025)
Urobilinogen, UA: 0.2 E.U./dL
pH, UA: 6.5 (ref 5.0–8.0)

## 2020-03-04 NOTE — Progress Notes (Signed)
I have reviewed the record and concur with patient management and plan of care.    Serafina Royals, CNM [facility], [facility] 03/04/20 4:33 PM

## 2020-03-04 NOTE — Patient Instructions (Signed)

## 2020-03-04 NOTE — Progress Notes (Signed)
Pt present for urine drop off. States she believes she a UTI. Pt states she noticed her urine is darker in the mornings. States she had burning sensation that has now gone away. Informed pt she may use Azo. Pt denies discharge, or being on cycle. Informed pt results may take 2-4 days to come in. Pt verbalized understanding

## 2020-03-07 LAB — URINE CULTURE

## 2020-03-09 ENCOUNTER — Telehealth: Payer: Self-pay

## 2020-03-09 ENCOUNTER — Other Ambulatory Visit: Payer: Self-pay | Admitting: Certified Nurse Midwife

## 2020-03-09 DIAGNOSIS — N3000 Acute cystitis without hematuria: Secondary | ICD-10-CM

## 2020-03-09 MED ORDER — SULFAMETHOXAZOLE-TRIMETHOPRIM 800-160 MG PO TABS
1.0000 | ORAL_TABLET | Freq: Two times a day (BID) | ORAL | 1 refills | Status: DC
Start: 2020-03-09 — End: 2021-01-06

## 2020-03-09 NOTE — Telephone Encounter (Signed)
Called pt per Serafina Royals, CNM to inform pt culture came back positive. Bactrim sent to pharmacy on file. Unable to leave voicemail due to vm not set up.

## 2020-03-09 NOTE — Progress Notes (Signed)
Rx Bactrim, see orders.    Serafina Royals, CNM [facility], [facility] 03/09/20 8:49 AM

## 2020-03-10 ENCOUNTER — Telehealth: Payer: Self-pay

## 2020-03-10 NOTE — Telephone Encounter (Signed)
Called pt per Gwendolyn Rogers, CNM and informed her that her urine culture came back positive. Script for Bactrim sent to pharmacy on file. Pt verbalized understanding. Pt not interested in activating mychart.

## 2020-03-12 ENCOUNTER — Ambulatory Visit (INDEPENDENT_AMBULATORY_CARE_PROVIDER_SITE_OTHER): Payer: BC Managed Care – PPO | Admitting: Certified Nurse Midwife

## 2020-03-12 ENCOUNTER — Other Ambulatory Visit: Payer: Self-pay

## 2020-03-12 ENCOUNTER — Encounter: Payer: Self-pay | Admitting: Certified Nurse Midwife

## 2020-03-12 VITALS — BP 100/70 | HR 69 | Ht 61.0 in | Wt 176.6 lb

## 2020-03-12 DIAGNOSIS — R238 Other skin changes: Secondary | ICD-10-CM

## 2020-03-12 DIAGNOSIS — Z87440 Personal history of urinary (tract) infections: Secondary | ICD-10-CM

## 2020-03-12 MED ORDER — HYDROCORTISONE 1 % EX CREA: 1.0000 "application " | TOPICAL_CREAM | Freq: Two times a day (BID) | CUTANEOUS | 0 refills | Status: DC

## 2020-03-12 NOTE — Patient Instructions (Addendum)
Urinary Tract Infection, Adult  A urinary tract infection (UTI) is an infection of any part of the urinary tract. The urinary tract includes the kidneys, ureters, bladder, and urethra. These organs make, store, and get rid of urine in the body. Your health care provider may use other names to describe the infection. An upper UTI affects the ureters and kidneys (pyelonephritis). A lower UTI affects the bladder (cystitis) and urethra (urethritis). What are the causes? Most urinary tract infections are caused by bacteria in your genital area, around the entrance to your urinary tract (urethra). These bacteria grow and cause inflammation of your urinary tract. What increases the risk? You are more likely to develop this condition if:  You have a urinary catheter that stays in place (indwelling).  You are not able to control when you urinate or have a bowel movement (you have incontinence).  You are female and you: ? Use a spermicide or diaphragm for birth control. ? Have low estrogen levels. ? Are pregnant.  You have certain genes that increase your risk (genetics).  You are sexually active.  You take antibiotic medicines.  You have a condition that causes your flow of urine to slow down, such as: ? An enlarged prostate, if you are female. ? Blockage in your urethra (stricture). ? A kidney stone. ? A nerve condition that affects your bladder control (neurogenic bladder). ? Not getting enough to drink, or not urinating often.  You have certain medical conditions, such as: ? Diabetes. ? A weak disease-fighting system (immunesystem). ? Sickle cell disease. ? Gout. ? Spinal cord injury. What are the signs or symptoms? Symptoms of this condition include:  Needing to urinate right away (urgently).  Frequent urination or passing small amounts of urine frequently.  Pain or burning with urination.  Blood in the urine.  Urine that smells bad or unusual.  Trouble urinating.  Cloudy  urine.  Vaginal discharge, if you are female.  Pain in the abdomen or the lower back. You may also have:  Vomiting or a decreased appetite.  Confusion.  Irritability or tiredness.  A fever.  Diarrhea. The first symptom in older adults may be confusion. In some cases, they may not have any symptoms until the infection has worsened. How is this diagnosed? This condition is diagnosed based on your medical history and a physical exam. You may also have other tests, including:  Urine tests.  Blood tests.  Tests for sexually transmitted infections (STIs). If you have had more than one UTI, a cystoscopy or imaging studies may be done to determine the cause of the infections. How is this treated? Treatment for this condition includes:  Antibiotic medicine.  Over-the-counter medicines to treat discomfort.  Drinking enough water to stay hydrated. If you have frequent infections or have other conditions such as a kidney stone, you may need to see a health care provider who specializes in the urinary tract (urologist). In rare cases, urinary tract infections can cause sepsis. Sepsis is a life-threatening condition that occurs when the body responds to an infection. Sepsis is treated in the hospital with IV antibiotics, fluids, and other medicines. Follow these instructions at home:  Medicines  Take over-the-counter and prescription medicines only as told by your health care provider.  If you were prescribed an antibiotic medicine, take it as told by your health care provider. Do not stop using the antibiotic even if you start to feel better. General instructions  Make sure you: ? Empty your bladder often and  completely. Do not hold urine for long periods of time. ? Empty your bladder after sex. ? Wipe from front to back after a bowel movement if you are female. Use each tissue one time when you wipe.  Drink enough fluid to keep your urine pale yellow.  Keep all follow-up  visits as told by your health care provider. This is important. Contact a health care provider if:  Your symptoms do not get better after 1-2 days.  Your symptoms go away and then return. Get help right away if you have:  Severe pain in your back or your lower abdomen.  A fever.  Nausea or vomiting. Summary  A urinary tract infection (UTI) is an infection of any part of the urinary tract, which includes the kidneys, ureters, bladder, and urethra.  Most urinary tract infections are caused by bacteria in your genital area, around the entrance to your urinary tract (urethra).  Treatment for this condition often includes antibiotic medicines.  If you were prescribed an antibiotic medicine, take it as told by your health care provider. Do not stop using the antibiotic even if you start to feel better.  Keep all follow-up visits as told by your health care provider. This is important. This information is not intended to replace advice given to you by your health care provider. Make sure you discuss any questions you have with your health care provider. Document Revised: 04/26/2018 Document Reviewed: 11/16/2017 Elsevier Patient Education  2020 Elsevier Inc.   Sulfamethoxazole; Trimethoprim, SMX-TMP tablets What is this medicine? SULFAMETHOXAZOLE; TRIMETHOPRIM or SMX-TMP (suhl fuh meth OK suh zohl; trye METH oh prim) is a combination of a sulfonamide antibiotic and a second antibiotic, trimethoprim. It is used to treat or prevent certain kinds of bacterial infections. It will not work for colds, flu, or other viral infections. This medicine may be used for other purposes; ask your health care provider or pharmacist if you have questions. COMMON BRAND NAME(S): Bacter-Aid DS, Bactrim, Bactrim DS, Septra, Septra DS What should I tell my health care provider before I take this medicine? They need to know if you have any of these conditions:  G6PD deficiency  HIV or AIDS  kidney  disease  liver disease  low platelets  low red blood cell counts  poor nutrition  stomach or intestine problems like colitis  thyroid disease  an unusual or allergic reaction to sulfamethoxazole, trimethoprim, sulfa drugs, other drugs, foods, dyes, or preservatives  pregnant or trying to get pregnant  breast-feeding How should I use this medicine? Take this medicine by mouth with a glass of water. Follow the directions on the prescription label. Take your medicine at regular intervals. Do not take it more often than directed. Take all of your medicine as directed even if you think you are better. Do not skip doses or stop your medicine early. Talk to your pediatrician regarding the use of this medicine in children. While this drug may be prescribed for children as young as 2 months for selected conditions, precautions do apply. Overdosage: If you think you have taken too much of this medicine contact a poison control center or emergency room at once. NOTE: This medicine is only for you. Do not share this medicine with others. What if I miss a dose? If you miss a dose, take it as soon as you can. If it is almost time for your next dose, take only that dose. Do not take double or extra doses. What may interact with this medicine?  Do not take this medicine with any of the following medications:  dofetilide This medicine may also interact with the following medications:  amantadine  birth control pills  certain medicines for blood pressure, heart disease  certain medicines for depression, like amitriptyline  certain medicines for diabetes, like glipizide or glyburide  certain medicines that treat or prevent blood clots like warfarin  cyclosporine  digoxin  diuretics  indomethacin  methotrexate  phenytoin  procainamide  pyrimethamine  zidovudine This list may not describe all possible interactions. Give your health care provider a list of all the medicines,  herbs, non-prescription drugs, or dietary supplements you use. Also tell them if you smoke, drink alcohol, or use illegal drugs. Some items may interact with your medicine. What should I watch for while using this medicine? Tell your health care provider if your symptoms do not start to get better or if they get worse. Do not treat diarrhea with over the counter products. Contact your health care provider if you have diarrhea that lasts more than 2 days or if it is severe and watery. This drug may cause serious skin reactions. They can happen weeks to months after starting the drug. Contact your health care provider right away if you notice fevers or flu-like symptoms with a rash. The rash may be red or purple and then turn into blisters or peeling of the skin. Or, you might notice a red rash with swelling of the face, lips or lymph nodes in your neck or under your arms. This drug can make you more sensitive to the sun. Keep out of the sun. If you cannot avoid being in the sun, wear protective clothing and sunscreen. Do not use sun lamps or tanning beds/booths. Be careful brushing or flossing your teeth or using a toothpick because you may get an infection or bleed more easily. If you have any dental work done, tell your dentist you are receiving this drug. What side effects may I notice from receiving this medicine? Side effects that you should report to your doctor or health care professional as soon as possible:  allergic reactions (skin rash, itching or hives; swelling of the face, lips, or tongue)  bloody or watery diarrhea  heartbeat rhythm changes (trouble breathing; chest pain; dizziness; fast, irregular heartbeat; feeling faint or lightheaded, falls,)  fever  high potassium levels (chest pain; fast, irregular heartbeat; muscle weakness)  kidney injury (trouble passing urine or change in the amount of urine)  low blood sugar (feeling anxious; confusion; dizziness; increased hunger;  unusually weak or tired; increased sweating; shakiness; cold, clammy skin; irritable; headache; blurred vision; fast heartbeat; loss of consciousness)  low red blood cell counts (trouble breathing; feeling faint; lightheaded, falls; unusually weak or tired)  rash, fever, and swollen lymph nodes  redness, blistering, peeling, or loosening of the skin, including inside the mouth  unusual bruising or bleeding Side effects that usually do not require medical attention (report to your doctor or health care professional if they continue or are bothersome):  loss of appetite  nausea  vomiting This list may not describe all possible side effects. Call your doctor for medical advice about side effects. You may report side effects to FDA at 1-800-FDA-1088. Where should I keep my medicine? Keep out of the reach of children. Store between 15 and 25 degrees C (59 to 77 degrees F). Protect from light. Keep the container tightly closed. Throw away any unused drug after the expiration date. NOTE: This sheet is a summary.  It may not cover all possible information. If you have questions about this medicine, talk to your doctor, pharmacist, or health care provider.  2020 Elsevier/Gold Standard (2019-01-25 12:53:51)   Hydrocortisone; Lidocaine skin cream or lotion What is this medicine? HYDROCORTISONE; LIDOCAINE (hye droe KOR ti sone; LYE doe kane) is a corticosteroid combined with an anesthetic pain reliever. It is used to decrease swelling, itching, and pain that is caused by minor skin irritations or hemorrhoids. This medicine may be used for other purposes; ask your health care provider or pharmacist if you have questions. COMMON BRAND NAME(S): Ana-Lex HC, AnaMantle HC, AnaMantle-HC Forte, LidaMantle HC, LidaZone HC, LidoCort, Lidosol-HC, Peranex HC, Senatec HC What should I tell my health care provider before I take this medicine? They need to know if you have any of these conditions:  large areas  of burned or damaged skin  liver disease  skin infection (especially a virus infection such as chickenpox, cold sores, or herpes  skin wasting or thinning  tuberculosis  an unusual or allergic reaction to hydrocortisone, lidocaine, other medicines, foods, dyes, or preservatives  pregnant or trying to get pregnant  breast-feeding How should I use this medicine? This medicine is for external use only. Do not take by mouth. Follow the directions on the prescription label. Wash your hands before and after use. Do not cover with a bandage or dressing unless your doctor or health care professional tells you yo. Use your medicine at regular intervals. Do not use it more often than directed. Talk to your pediatrician regarding the use of this medicine in children. Special care may be needed. Overdosage: If you think you have taken too much of this medicine contact a poison control center or emergency room at once. NOTE: This medicine is only for you. Do not share this medicine with others. What if I miss a dose? If you miss a dose, use it as soon as you can. If it is almost time for your next dose, use only that dose. Do not use double or extra doses. What may interact with this medicine?  certain medicines for irregular heart beat Do not use any other skin products on the same area of skin without asking your doctor or health care professional. This list may not describe all possible interactions. Give your health care provider a list of all the medicines, herbs, non-prescription drugs, or dietary supplements you use. Also tell them if you smoke, drink alcohol, or use illegal drugs. Some items may interact with your medicine. What should I watch for while using this medicine? Tell your doctor or health care professional if your symptoms do not start to get better or if they get worse. Do not get this medicine in your eyes. If you do, rinse out with plenty of cool tap water. This medicine can  make certain skin conditions worse. Only use it for conditions for which your doctor or health care professional has prescribed. Be careful to avoid injury to the treated skin while the area is numb and you are not aware of pain. What side effects may I notice from receiving this medicine? Side effects that you should report to your doctor or health care professional as soon as possible:  allergic reactions like skin rash, itching or hives, swelling of the face, lips, or tongue  burning feeling on the skin  confusion  dizziness  infection  lack of healing of skin conditions  painful, red, pus-filled blisters in hair follicles  palpitations  thinning  of the skin  tremor Side effects that usually do not require medical attention (report to your doctor or health care professional if they continue or are bothersome):  dry skin, irritation  unusual increased growth of hair on the face or body This list may not describe all possible side effects. Call your doctor for medical advice about side effects. You may report side effects to FDA at 1-800-FDA-1088. Where should I keep my medicine? Keep out of the reach of children. Store at room temperature between 15 and 30 degrees C (59 and 86 degrees F). Throw away any unused medicine after the expiration date. NOTE: This sheet is a summary. It may not cover all possible information. If you have questions about this medicine, talk to your doctor, pharmacist, or health care provider.  2020 Elsevier/Gold Standard (2018-02-07 12:08:21)

## 2020-03-12 NOTE — Progress Notes (Signed)
GYN ENCOUNTER NOTE  Subjective:       Gwendolyn Rogers is a 24 y.o. G0P0000 female is here for gynecologic evaluation of the following issues:  1. "Pimples on right breast and buttocks"-first noticed last week, no itching or pain, denies sweating to area  Patient dropped off urine earlier this week and was diagnosed with UTI; has not picked up prescribed antibiotics.     Denies difficulty breathing or respiratory distress, chest pain, abdominal pain, excessive vaginal bleeding, dysuria, and leg pain or swelling.    Gynecologic History  Patient's last menstrual period was 03/09/2020.  Contraception: none  Last Pap: 2019. Results were: normal per patient  Obstetric History  OB History  Gravida Para Term Preterm AB Living  0 0 0 0 0 0  SAB TAB Ectopic Multiple Live Births  0 0 0 0 0    Past Medical History:  Diagnosis Date  . Heavy periods     Past Surgical History:  Procedure Laterality Date  . LAPAROSCOPIC GASTRIC BAND REMOVAL WITH LAPAROSCOPIC GASTRIC SLEEVE RESECTION      Current Outpatient Medications on File Prior to Visit  Medication Sig Dispense Refill  . ibuprofen (ADVIL) 600 MG tablet Take 1 tablet (600 mg total) by mouth every 6 (six) hours as needed. (Patient not taking: Reported on 03/12/2020) 30 tablet 0  . methocarbamol (ROBAXIN) 500 MG tablet Take 1 tablet (500 mg total) by mouth every 8 (eight) hours as needed for muscle spasms. (Patient not taking: Reported on 03/12/2020) 15 tablet 0  . norgestimate-ethinyl estradiol (ORTHO-CYCLEN,SPRINTEC,PREVIFEM) 0.25-35 MG-MCG tablet Take 1 tablet by mouth daily. (Patient not taking: Reported on 08/05/2019) 1 Package 11  . sulfamethoxazole-trimethoprim (BACTRIM DS) 800-160 MG tablet Take 1 tablet by mouth 2 (two) times daily. (Patient not taking: Reported on 03/12/2020) 14 tablet 1   No current facility-administered medications on file prior to visit.    No Known Allergies  Social History   Socioeconomic  History  . Marital status: Single    Spouse name: Not on file  . Number of children: Not on file  . Years of education: Not on file  . Highest education level: Not on file  Occupational History  . Not on file  Tobacco Use  . Smoking status: Current Every Day Smoker    Types: E-cigarettes    Start date: 08/20/2018  . Smokeless tobacco: Never Used  . Tobacco comment: vapes every day  Vaping Use  . Vaping Use: Every day  . Substances: Nicotine, Flavoring  Substance and Sexual Activity  . Alcohol use: Yes    Comment: rare  . Drug use: Never  . Sexual activity: Not Currently  Other Topics Concern  . Not on file  Social History Narrative   ** Merged History Encounter **       Social Determinants of Health   Financial Resource Strain:   . Difficulty of Paying Living Expenses: Not on file  Food Insecurity:   . Worried About Programme researcher, broadcasting/film/video in the Last Year: Not on file  . Ran Out of Food in the Last Year: Not on file  Transportation Needs:   . Lack of Transportation (Medical): Not on file  . Lack of Transportation (Non-Medical): Not on file  Physical Activity:   . Days of Exercise per Week: Not on file  . Minutes of Exercise per Session: Not on file  Stress:   . Feeling of Stress : Not on file  Social Connections:   . Frequency  of Communication with Friends and Family: Not on file  . Frequency of Social Gatherings with Friends and Family: Not on file  . Attends Religious Services: Not on file  . Active Member of Clubs or Organizations: Not on file  . Attends Banker Meetings: Not on file  . Marital Status: Not on file  Intimate Partner Violence:   . Fear of Current or Ex-Partner: Not on file  . Emotionally Abused: Not on file  . Physically Abused: Not on file  . Sexually Abused: Not on file    History reviewed. No pertinent family history.  The following portions of the patient's history were reviewed and updated as appropriate: allergies, current  medications, past family history, past medical history, past social history, past surgical history and problem list.  Review of Systems  ROS negative except as noted above. Information obtained from patient.   Objective:   BP 100/70   Pulse 69   Ht 5\' 1"  (1.549 m)   Wt 176 lb 9.6 oz (80.1 kg)   LMP 03/09/2020   BMI 33.37 kg/m    CONSTITUTIONAL: Well-developed, well-nourished female in no acute distress.   SKIN: Skin is warm and dry. No rash noted. Not diaphoretic. No erythema. No pallor. Diffuse pinhead sized vesicles to breast and buttocks (cluster of three).   MUSCULOSKELETAL: Normal range of motion. No tenderness.  No cyanosis, clubbing, or edema.   Assessment:   1. Vesicles  - Herpes simplex virus culture  2. History of UTI   Plan:   Culture collected, see orders.   Encouraged to pick up Rx to treatment UTI.   Reviewed red flag symptoms and when to call.   RTC as previously scheduled or sooner if needed.    03/11/2020, CNM [facility], [facility] 03/12/20 1:29 PM

## 2020-03-15 LAB — HERPES SIMPLEX VIRUS CULTURE

## 2020-03-15 NOTE — Progress Notes (Signed)
Please contact patient. Culture negative for HSV. Encourage to activate MyChart. Thanks, JML

## 2020-03-16 ENCOUNTER — Telehealth: Payer: Self-pay

## 2020-03-16 NOTE — Telephone Encounter (Signed)
Per Serafina Royals, CNM called pt to inform culture was negative for HSV. Pt verbalized understanding. Encouraged pt to activate mychart- pt declined.

## 2020-08-06 ENCOUNTER — Encounter: Payer: BC Managed Care – PPO | Admitting: Certified Nurse Midwife

## 2020-08-27 ENCOUNTER — Encounter: Payer: Self-pay | Admitting: Certified Nurse Midwife

## 2020-11-20 ENCOUNTER — Encounter: Payer: 59 | Admitting: Certified Nurse Midwife

## 2021-01-06 ENCOUNTER — Ambulatory Visit (INDEPENDENT_AMBULATORY_CARE_PROVIDER_SITE_OTHER): Payer: Self-pay | Admitting: Certified Nurse Midwife

## 2021-01-06 ENCOUNTER — Encounter: Payer: Self-pay | Admitting: Certified Nurse Midwife

## 2021-01-06 ENCOUNTER — Other Ambulatory Visit: Payer: Self-pay

## 2021-01-06 VITALS — BP 109/65 | HR 82 | Ht 61.2 in | Wt 195.1 lb

## 2021-01-06 DIAGNOSIS — Z3200 Encounter for pregnancy test, result unknown: Secondary | ICD-10-CM

## 2021-01-06 LAB — POCT URINE PREGNANCY: Preg Test, Ur: POSITIVE — AB

## 2021-01-06 NOTE — Progress Notes (Signed)
Subjective:    Gwendolyn Rogers is a 25 y.o. female who presents for evaluation of amenorrhea. She believes she could be pregnant. Pregnancy is desired. Sexual Activity: single partner, contraception: none. Current symptoms also include: nausea. Last period was not sure of exact date of LMP.  Body mass index is 36.62 kg/m.    Patient's last menstrual period was 10/24/2020 (approximate). The following portions of the patient's history were reviewed and updated as appropriate: allergies, current medications, past family history, past medical history, past social history, past surgical history, and problem list.  Review of Systems Pertinent items are noted in HPI.     Objective:    BP 109/65   Pulse 82   Ht 5' 1.2" (1.554 m)   Wt 195 lb 1.6 oz (88.5 kg)   LMP 10/24/2020 (Approximate)   BMI 36.62 kg/m  General: alert, cooperative, appears stated age, and no acute distress    Lab Review Urine HCG: positive    Assessment:    Absence of menstruation.     Plan:    Pregnancy Test:  Positive: EDC: 07/31/2021. Briefly discussed pre-natal care options. MD and midwifery service reviewed. Encouraged well-balanced diet, plenty of rest when needed, pre-natal vitamins daily and walking for exercise. Discussed self-help for nausea, avoiding OTC medications until consulting provider or pharmacist, other than Tylenol as needed, minimal caffeine (1-2 cups daily) and avoiding alcohol. She will schedule  u/s for dating, nurse visit and her initial NOB visit in 3 wks. Feel free to call with any questions.

## 2021-01-06 NOTE — Patient Instructions (Signed)
https://www.[facility].org/womens-health/faqs/prenatal-genetic-screening-tests">  Prenatal Care Prenatal care is health care during pregnancy. It helps you and your unborn baby (fetus) stay as healthy as possible. Prenatal care may be provided by a midwife, a family practice doctor, a mid-level practitioner (nurse practitioner or physician assistant), or a childbirth and pregnancy doctor (obstetrician). How does this affect me? During pregnancy, you will be closely monitored for any new conditions that might develop. To lower your risk of pregnancy complications, you and yourhealth care provider will talk about any underlying conditions you have. How does this affect my baby? Early and consistent prenatal care increases the chance that your baby will be healthy during pregnancy. Prenatal care lowers the risk that your baby will be: Born early (prematurely). Smaller than expected at birth (small for gestational age). What can I expect at the first prenatal care visit? Your first prenatal care visit will likely be the longest. You should schedule your first prenatal care visit as soon as you know that you are pregnant. Your first visit is a good time to talk about any questions or concerns you haveabout pregnancy. Medical history At your visit, you and your health care provider will talk about your medical history, including: Any past pregnancies. Your family's medical history. Medical history of the baby's father. Any long-term (chronic) health conditions you have and how you manage them. Any surgeries or procedures you have had. Any current over-the-counter or prescription medicines, herbs, or supplements that you are taking. Other factors that could pose a risk to your baby, including: Exposure to harmful chemicals or radiation at work or at home. Any substance use, including tobacco, alcohol, and drug use. Your home setting and your stress levels, including: Exposure to abuse or  violence. Household financial strain. Your daily health habits, including diet and exercise. Tests and screenings Your health care provider will: Measure your weight, height, and blood pressure. Do a physical exam, including a pelvic and breast exam. Perform blood tests and urine tests to check for: Urinary tract infection. Sexually transmitted infections (STIs). Low iron levels in your blood (anemia). Blood type and certain proteins on red blood cells (Rh antibodies). Infections and immunity to viruses, such as hepatitis B and rubella. HIV (human immunodeficiency virus). Discuss your options for genetic screening. Tips about staying healthy Your health care provider will also give you information about how to keep yourself and your baby healthy, including: Nutrition and taking vitamins. Physical activity. How to manage pregnancy symptoms such as nausea and vomiting (morning sickness). Infections and substances that may be harmful to your baby and how to avoid them. Food safety. Dental care. Working. Travel. Warning signs to watch for and when to call your health care provider. How often will I have prenatal care visits? After your first prenatal care visit, you will have regular visits throughout your pregnancy. The visit schedule is often as follows: Up to week 28 of pregnancy: once every 4 weeks. 28-36 weeks: once every 2 weeks. After 36 weeks: every week until delivery. Some women may have visits more or less often depending on any underlyinghealth conditions and the health of the baby. Keep all follow-up and prenatal care visits. This is important. What happens during routine prenatal care visits? Your health care provider will: Measure your weight and blood pressure. Check for fetal heart sounds. Measure the height of your uterus in your abdomen (fundal height). This may be measured starting around week 20 of pregnancy. Check the position of your baby inside your  uterus. Ask questions   about your diet, sleeping patterns, and whether you can feel the baby move. Review warning signs to watch for and signs of labor. Ask about any pregnancy symptoms you are having and how you are dealing with them. Symptoms may include: Headaches. Nausea and vomiting. Vaginal discharge. Swelling. Fatigue. Constipation. Changes in your vision. Feeling persistently sad or anxious. Any discomfort, including back or pelvic pain. Bleeding or spotting. Make a list of questions to ask your health care provider at your routinevisits. What tests might I have during prenatal care visits? You may have blood, urine, and imaging tests throughout your pregnancy, such as: Urine tests to check for glucose, protein, or signs of infection. Glucose tests to check for a form of diabetes that can develop during pregnancy (gestational diabetes mellitus). This is usually done around week 24 of pregnancy. Ultrasounds to check your baby's growth and development, to check for birth defects, and to check your baby's well-being. These can also help to decide when you should deliver your baby. A test to check for group B strep (GBS) infection. This is usually done around week 36 of pregnancy. Genetic testing. This may include blood, fluid, or tissue sampling, or imaging tests, such as an ultrasound. Some genetic tests are done during the first trimester and some are done during the second trimester. What else can I expect during prenatal care visits? Your health care provider may recommend getting certain vaccines during pregnancy. These may include: A yearly flu shot (annual influenza vaccine). This is especially important if you will be pregnant during flu season. Tdap (tetanus, diphtheria, pertussis) vaccine. Getting this vaccine during pregnancy can protect your baby from whooping cough (pertussis) after birth. This vaccine may be recommended between weeks 27 and 36 of pregnancy. A COVID-19  vaccine. Later in your pregnancy, your health care provider may give you information about: Childbirth and breastfeeding classes. Choosing a health care provider for your baby. Umbilical cord banking. Breastfeeding. Birth control after your baby is born. The hospital labor and delivery unit and how to set up a tour. Registering at the hospital before you go into labor. Where to find more information Office on Women's Health: womenshealth.gov American Pregnancy Association: americanpregnancy.org March of Dimes: marchofdimes.org Summary Prenatal care helps you and your baby stay as healthy as possible during pregnancy. Your first prenatal care visit will most likely be the longest. You will have visits and tests throughout your pregnancy to monitor your health and your baby's health. Bring a list of questions to your visits to ask your health care provider. Make sure to keep all follow-up and prenatal care visits. This information is not intended to replace advice given to you by your health care provider. Make sure you discuss any questions you have with your healthcare provider. Document Revised: 02/20/2020 Document Reviewed: 02/20/2020 Elsevier Patient Education  2022 [employer]  

## 2021-01-14 ENCOUNTER — Other Ambulatory Visit: Payer: Self-pay

## 2021-01-14 ENCOUNTER — Ambulatory Visit (INDEPENDENT_AMBULATORY_CARE_PROVIDER_SITE_OTHER): Payer: Self-pay | Admitting: Certified Nurse Midwife

## 2021-01-14 VITALS — BP 116/71 | HR 69 | Wt 196.5 lb

## 2021-01-14 DIAGNOSIS — Z3401 Encounter for supervision of normal first pregnancy, first trimester: Secondary | ICD-10-CM

## 2021-01-14 DIAGNOSIS — O99210 Obesity complicating pregnancy, unspecified trimester: Secondary | ICD-10-CM

## 2021-01-14 DIAGNOSIS — Z113 Encounter for screening for infections with a predominantly sexual mode of transmission: Secondary | ICD-10-CM

## 2021-01-14 DIAGNOSIS — Z3A11 11 weeks gestation of pregnancy: Secondary | ICD-10-CM

## 2021-01-14 NOTE — Patient Instructions (Signed)
Commonly Asked Questions During Pregnancy  Cats: A parasite can be excreted in cat feces.  To avoid exposure you need to have another person empty the little box.  If you must empty the litter box you will need to wear gloves.  Wash your hands after handling your cat.  This parasite can also be found in raw or undercooked meat so this should also be avoided.  Colds, Sore Throats, Flu: Please check your medication sheet to see what you can take for symptoms.  If your symptoms are unrelieved by these medications please call the office.  Dental Work: Most any dental work your dentist recommends is permitted.  X-rays should only be taken during the first trimester if absolutely necessary.  Your abdomen should be shielded with a lead apron during all x-rays.  Please notify your provider prior to receiving any x-rays.  Novocaine is fine; gas is not recommended.  If your dentist requires a note from us prior to dental work please call the office and we will provide one for you.  Exercise: Exercise is an important part of staying healthy during your pregnancy.  You may continue most exercises you were accustomed to prior to pregnancy.  Later in your pregnancy you will most likely notice you have difficulty with activities requiring balance like riding a bicycle.  It is important that you listen to your body and avoid activities that put you at a higher risk of falling.  Adequate rest and staying well hydrated are a must!  If you have questions about the safety of specific activities ask your provider.    Exposure to Children with illness: Try to avoid obvious exposure; report any symptoms to us when noted,  If you have chicken pos, red measles or mumps, you should be immune to these diseases.   Please do not take any vaccines while pregnant unless you have checked with your OB provider.  Fetal Movement: After 28 weeks we recommend you do "kick counts" twice daily.  Lie or sit down in a calm quiet environment and  count your baby movements "kicks".  You should feel your baby at least 10 times per hour.  If you have not felt 10 kicks within the first hour get up, walk around and have something sweet to eat or drink then repeat for an additional hour.  If count remains less than 10 per hour notify your provider.  Fumigating: Follow your pest control agent's advice as to how long to stay out of your home.  Ventilate the area well before re-entering.  Hemorrhoids:   Most over-the-counter preparations can be used during pregnancy.  Check your medication to see what is safe to use.  It is important to use a stool softener or fiber in your diet and to drink lots of liquids.  If hemorrhoids seem to be getting worse please call the office.   Hot Tubs:  Hot tubs Jacuzzis and saunas are not recommended while pregnant.  These increase your internal body temperature and should be avoided.  Intercourse:  Sexual intercourse is safe during pregnancy as long as you are comfortable, unless otherwise advised by your provider.  Spotting may occur after intercourse; report any bright red bleeding that is heavier than spotting.  Labor:  If you know that you are in labor, please go to the hospital.  If you are unsure, please call the office and let us help you decide what to do.  Lifting, straining, etc:  If your job requires heavy   lifting or straining please check with your provider for any limitations.  Generally, you should not lift items heavier than that you can lift simply with your hands and arms (no back muscles)  Painting:  Paint fumes do not harm your pregnancy, but may make you ill and should be avoided if possible.  Latex or water based paints have less odor than oils.  Use adequate ventilation while painting.  Permanents & Hair Color:  Chemicals in hair dyes are not recommended as they cause increase hair dryness which can increase hair loss during pregnancy.  " Highlighting" and permanents are allowed.  Dye may be  absorbed differently and permanents may not hold as well during pregnancy.  Sunbathing:  Use a sunscreen, as skin burns easily during pregnancy.  Drink plenty of fluids; avoid over heating.  Tanning Beds:  Because their possible side effects are still unknown, tanning beds are not recommended.  Ultrasound Scans:  Routine ultrasounds are performed at approximately 20 weeks.  You will be able to see your baby's general anatomy an if you would like to know the gender this can usually be determined as well.  If it is questionable when you conceived you may also receive an ultrasound early in your pregnancy for dating purposes.  Otherwise ultrasound exams are not routinely performed unless there is a medical necessity.  Although you can request a scan we ask that you pay for it when conducted because insurance does not cover " patient request" scans.  Work: If your pregnancy proceeds without complications you may work until your due date, unless your physician or employer advises otherwise.  Round Ligament Pain/Pelvic Discomfort:  Sharp, shooting pains not associated with bleeding are fairly common, usually occurring in the second trimester of pregnancy.  They tend to be worse when standing up or when you remain standing for long periods of time.  These are the result of pressure of certain pelvic ligaments called "round ligaments".  Rest, Tylenol and heat seem to be the most effective relief.  As the womb and fetus grow, they rise out of the pelvis and the discomfort improves.  Please notify the office if your pain seems different than that described.  It may represent a more serious condition.  [email or web address]">  First Trimester of Pregnancy  The first trimester of pregnancy starts on the first day of your last menstrual period until the end of week 12. This is also called months 1 through 3 ofpregnancy. Body changes during your first trimester Your body goes through many  changes during pregnancy. The changes usuallyreturn to normal after your baby is born. Physical changes You may gain or lose weight. Your breasts may grow larger and hurt. The area around your nipples may get darker. Dark spots or blotches may develop on your face. You may have changes in your hair. Health changes You may feel like you might vomit (nauseous), and you may vomit. You may have heartburn. You may have headaches. You may have trouble pooping (constipation). Your gums may bleed. Other changes You may get tired easily. You may pee (urinate) more often. Your menstrual periods will stop. You may not feel hungry. You may want to eat certain kinds of food. You may have changes in your emotions from day to day. You may have more dreams. Follow these instructions at home: Medicines Take over-the-counter and prescription medicines only as told by your doctor. Some medicines are not safe during pregnancy. Take a prenatal vitamin that contains at least 600  micrograms (mcg) of folic acid. Eating and drinking Eat healthy meals that include: Fresh fruits and vegetables. Whole grains. Good sources of protein, such as meat, eggs, or tofu. Low-fat dairy products. Avoid raw meat and unpasteurized juice, milk, and cheese. If you feel like you may vomit, or you vomit: Eat 4 or 5 small meals a day instead of 3 large meals. Try eating a few soda crackers. Drink liquids between meals instead of during meals. You may need to take these actions to prevent or treat trouble pooping: Drink enough fluids to keep your pee (urine) pale yellow. Eat foods that are high in fiber. These include beans, whole grains, and fresh fruits and vegetables. Limit foods that are high in fat and sugar. These include fried or sweet foods. Activity Exercise only as told by your doctor. Most people can do their usual exercise routine during pregnancy. Stop exercising if you have cramps or pain in your lower belly  (abdomen) or low back. Do not exercise if it is too hot or too humid, or if you are in a place of great height (high altitude). Avoid heavy lifting. If you choose to, you may have sex unless your doctor tells you not to. Relieving pain and discomfort Wear a good support bra if your breasts are sore. Rest with your legs raised (elevated) if you have leg cramps or low back pain. If you have bulging veins (varicose veins) in your legs: Wear support hose as told by your doctor. Raise your feet for 15 minutes, 3-4 times a day. Limit salt in your food. Safety Wear your seat belt at all times when you are in a car. Talk with your doctor if someone is hurting you or yelling at you. Talk with your doctor if you are feeling sad or have thoughts of hurting yourself. Lifestyle Do not use hot tubs, steam rooms, or saunas. Do not douche. Do not use tampons or scented sanitary pads. Do not use herbal medicines, illegal drugs, or medicines that are not approved by your doctor. Do not drink alcohol. Do not smoke or use any products that contain nicotine or tobacco. If you need help quitting, ask your doctor. Avoid cat litter boxes and soil that is used by cats. These carry germs that can cause harm to the baby and can cause a loss of your baby by miscarriage or stillbirth. General instructions Keep all follow-up visits. This is important. Ask for help if you need counseling or if you need help with nutrition. Your doctor can give you advice or tell you where to go for help. Visit your dentist. At home, brush your teeth with a soft toothbrush. Floss gently. Write down your questions. Take them to your prenatal visits. Where to find more information American Pregnancy Association: americanpregnancy.org [employer]: www.acog.org Office on Women's Health: MightyReward.co.nz Contact a doctor if: You are dizzy. You have a fever. You have mild cramps or  pressure in your lower belly. You have a nagging pain in your belly area. You continue to feel like you may vomit, you vomit, or you have watery poop (diarrhea) for 24 hours or longer. You have a bad-smelling fluid coming from your vagina. You have pain when you pee. You are exposed to a disease that spreads from person to person, such as chickenpox, measles, Zika virus, HIV, or hepatitis. Get help right away if: You have spotting or bleeding from your vagina. You have very bad belly cramping or pain. You have  shortness of breath or chest pain. You have any kind of injury, such as from a fall or a car crash. You have new or increased pain, swelling, or redness in an arm or leg. Summary The first trimester of pregnancy starts on the first day of your last menstrual period until the end of week 12 (months 1 through 3). Eat 4 or 5 small meals a day instead of 3 large meals. Do not smoke or use any products that contain nicotine or tobacco. If you need help quitting, ask your doctor. Keep all follow-up visits. This information is not intended to replace advice given to you by your health care provider. Make sure you discuss any questions you have with your healthcare provider. Document Revised: 10/16/2019 Document Reviewed: 08/22/2019 Elsevier Patient Education  2022 Elsevier Inc. Morning Sickness  Morning sickness is when you feel like you may vomit (feel nauseous) during pregnancy. Sometimes, you may vomit. Morning sickness most often happens in the morning, but it can also happen at any time of the day. Some women may have morning sickness that makes them vomit all the time. This is amore serious problem that needs treatment. What are the causes? The cause of this condition is not known. What increases the risk? You had vomiting or a feeling like you may vomit before your pregnancy. You had morning sickness in another pregnancy. You are pregnant with more than one baby, such as  twins. What are the signs or symptoms? Feeling like you may vomit. Vomiting. How is this treated? Treatment is usually not needed for this condition. You may only need to change what you eat. In some cases, your doctor may give you some things to take for your condition. These include: Vitamin B6 supplements. Medicines to treat the feeling that you may vomit. Ginger. Follow these instructions at home: Medicines Take over-the-counter and prescription medicines only as told by your doctor. Do not take any medicines until you talk with your doctor about them first. Take multivitamins before you get pregnant. These can stop or lessen the symptoms of morning sickness. Eating and drinking Eat dry toast or crackers before getting out of bed. Eat 5 or 6 small meals a day. Eat dry and bland foods like rice and baked potatoes. Do not eat greasy, fatty, or spicy foods. Have someone cook for you if the smell of food causes you to vomit or to feel like you may vomit. If you feel like you may vomit after taking prenatal vitamins, take them at night or with a snack. Eat protein foods when you need a snack. Nuts, yogurt, and cheese are good choices. Drink fluids throughout the day. Try ginger ale made with real ginger, ginger tea made from fresh grated ginger, or ginger candies. General instructions Do not smoke or use any products that contain nicotine or tobacco. If you need help quitting, ask your doctor. Use an air purifier to keep the air in your house free of smells. Get lots of fresh air. Try to avoid smells that make you feel sick. Try wearing an acupressure wristband. This is a wristband that is used to treat seasickness. Try a treatment called acupuncture. In this treatment, a doctor puts needles into certain areas of your body to make you feel better. Contact a doctor if: You need medicine to feel better. You feel dizzy or light-headed. You are losing weight. Get help right away if: The  feeling that you may vomit will not go away, or you cannot  stop vomiting. You faint. You have very bad pain in your belly. Summary Morning sickness is when you feel like you may vomit (feel nauseous) during pregnancy. You may feel sick in the morning, but you can feel this way at any time of the day. Making some changes to what you eat may help your symptoms go away. This information is not intended to replace advice given to you by your health care provider. Make sure you discuss any questions you have with your healthcare provider. Document Revised: 12/23/2019 Document Reviewed: 12/02/2019 Elsevier Patient Education  2022 Elsevier Inc. [employer]  [address] [place], [place], [place] [address]  Phone: [phone]  [facility] (second location)  [address] [place], [place] [address]  Phone: [phone]  [facility]) [address] [place], [place], [place] [address] Phone: [phone]  [facility]  [address], [place], [place] [address]  Phone: [phone] Class  November 02, 2016  Wednesday 7:00p - 9:00p  [facility] [place], [place]  December 07, 2016  Wednesday 7:00p - 9:00p [facility] [place], [place]    January 11, 2017   Wednesday 7:00p - 9:000p [facility] [place], [place]  February 08, 2017  Wednesday  7:00p - 9:00p [facility] [place], [place]  March 08, 2017 Wednesday 7:00p - 9:00p [facility] [place], [place]  Interested in a waterbirth?  This informational class will help you discover whether waterbirth is the right fit for you.  Education about waterbirth itself, supplies you would need and how to assemble your support team is what you can expect from this class.  Some obstetrical practices require this class in order to pursue a waterbirth.  (Not all obstetrical  practices offer waterbirth check with your healthcare provider)  Register only the expectant mom, but you are encouraged to bring your partner to class!  Fees & Payment No fee  Register Online [email or web address] Search [place]

## 2021-01-14 NOTE — Progress Notes (Signed)
Gwendolyn Rogers presents for NOB nurse interview visit. Pregnancy confirmation done 01/06/2021. [id number] . Pregnancy education material explained and given. 0 cats in the home. NOB labs ordered. TSH/HbgA1c due to Increased BMI. Body mass index is 36.89 kg/m. HIV labs and Drug screen were explained optional and she did not decline. Drug screen ordered. PNV encouraged. Genetic screening options discussed. Genetic testing: Ordered/. Pt may discuss with provider.  Financial policy not reviewed, she does not have insurance, plans to apply for medicaid.  FMLA from reviewed and signed. Pt. To follow up with provider in 3 weeks for NOB physical.  All questions answered.

## 2021-01-15 LAB — URINALYSIS, ROUTINE W REFLEX MICROSCOPIC
Bilirubin, UA: NEGATIVE
Glucose, UA: NEGATIVE
Ketones, UA: NEGATIVE
Leukocytes,UA: NEGATIVE
Nitrite, UA: NEGATIVE
RBC, UA: NEGATIVE
Specific Gravity, UA: >=1.03 — AB (ref 1.005–1.030)
Urobilinogen, Ur: 1 mg/dL (ref 0.2–1.0)
pH, UA: 5.5 (ref 5.0–7.5)

## 2021-01-15 LAB — VIRAL HEPATITIS HBV, HCV
HCV Ab: <0.1 s/co ratio (ref 0.0–0.9)
Hep B Core Total Ab: NEGATIVE
Hep B Surface Ab, Qual: REACTIVE
Hepatitis B Surface Ag: NEGATIVE

## 2021-01-15 LAB — HEMOGLOBIN A1C
Est. average glucose Bld gHb Est-mCnc: 91 mg/dL
Hgb A1c MFr Bld: 4.8 % (ref 4.8–5.6)

## 2021-01-15 LAB — RPR: RPR Ser Ql: NONREACTIVE

## 2021-01-15 LAB — TSH: TSH: 1.76 u[IU]/mL (ref 0.450–4.500)

## 2021-01-15 LAB — HCV INTERPRETATION

## 2021-01-15 LAB — PARVOVIRUS B19 ANTIBODY, IGG AND IGM
Parvovirus B19 IgG: 8.9 index — ABNORMAL HIGH (ref 0.0–0.8)
Parvovirus B19 IgM: 0.2 index (ref 0.0–0.8)

## 2021-01-15 LAB — ANTIBODY SCREEN: Antibody Screen: NEGATIVE

## 2021-01-15 LAB — ABO AND RH: Rh Factor: POSITIVE

## 2021-01-15 LAB — VARICELLA ZOSTER ANTIBODY, IGG: Varicella zoster IgG: 480 index (ref >165)

## 2021-01-15 LAB — RUBELLA SCREEN: Rubella Antibodies, IGG: 1.35 index (ref >0.99)

## 2021-01-15 LAB — HIV ANTIBODY (ROUTINE TESTING W REFLEX): HIV Screen 4th Generation wRfx: NONREACTIVE

## 2021-01-16 LAB — MONITOR DRUG PROFILE 14(MW)
Amphetamine Scrn, Ur: NEGATIVE ng/mL
BARBITURATE SCREEN URINE: NEGATIVE ng/mL
BENZODIAZEPINE SCREEN, URINE: NEGATIVE ng/mL
Buprenorphine, Urine: NEGATIVE ng/mL
CANNABINOIDS UR QL SCN: NEGATIVE ng/mL
Cocaine (Metab) Scrn, Ur: NEGATIVE ng/mL
Creatinine(Crt), U: 192.3 mg/dL (ref 20.0–300.0)
Fentanyl, Urine: NEGATIVE pg/mL
Meperidine Screen, Urine: NEGATIVE ng/mL
Methadone Screen, Urine: NEGATIVE ng/mL
OXYCODONE+OXYMORPHONE UR QL SCN: NEGATIVE ng/mL
Opiate Scrn, Ur: NEGATIVE ng/mL
Ph of Urine: 5.7 (ref 4.5–8.9)
Phencyclidine Qn, Ur: NEGATIVE ng/mL
Propoxyphene Scrn, Ur: NEGATIVE ng/mL
SPECIFIC GRAVITY: 1.029
Tramadol Screen, Urine: NEGATIVE ng/mL

## 2021-01-16 LAB — URINE CULTURE, OB REFLEX: Organism ID, Bacteria: NO GROWTH

## 2021-01-16 LAB — NICOTINE SCREEN, URINE: Cotinine Ql Scrn, Ur: NEGATIVE ng/mL

## 2021-01-16 LAB — CULTURE, OB URINE

## 2021-01-19 LAB — GC/CHLAMYDIA PROBE AMP
Chlamydia trachomatis, NAA: NEGATIVE
Neisseria Gonorrhoeae by PCR: NEGATIVE

## 2021-02-01 ENCOUNTER — Ambulatory Visit
Admission: RE | Admit: 2021-02-01 | Discharge: 2021-02-01 | Disposition: A | Discharge status: Home / Self Care | Payer: BLUE CROSS/BLUE SHIELD | Source: Ambulatory Visit | Attending: Certified Nurse Midwife | Admitting: Certified Nurse Midwife

## 2021-02-01 ENCOUNTER — Other Ambulatory Visit: Payer: Self-pay

## 2021-02-01 DIAGNOSIS — Z3200 Encounter for pregnancy test, result unknown: Secondary | ICD-10-CM | POA: Insufficient documentation

## 2021-02-04 NOTE — Patient Instructions (Addendum)
Second Trimester of Pregnancy The second trimester of pregnancy is from week 13 through week 27. This is also called months 4 through 6 of pregnancy. This is often the time when you feel your best. During the second trimester: Morning sickness is less or has stopped. You may have more energy. You may feel hungry more often. At this time, your unborn baby (fetus) is growing very fast. At the end of the sixth month, the unborn baby may be up to 12 inches long and weigh about 1 pounds. You will likely start to feel the baby move between 16 and 20 weeks of pregnancy. Body changes during your second trimester Your body continues to go through many changes during this time. The changes vary and generally return to normal after the baby is born. Physical changes You will gain more weight. You may start to get stretch marks on your hips, belly (abdomen), and breasts. Your breasts will grow and may hurt. Dark spots or blotches may develop on your face. A dark line from your belly button to the pubic area (linea nigra) may appear. You may have changes in your hair. Health changes You may have headaches. You may have heartburn. You may have trouble pooping (constipation). You may have hemorrhoids or swollen, bulging veins (varicose veins). Your gums may bleed. You may pee (urinate) more often. You may have back pain. Follow these instructions at home: Medicines Take over-the-counter and prescription medicines only as told by your doctor. Some medicines are not safe during pregnancy. Take a prenatal vitamin that contains at least 600 micrograms (mcg) of folic acid. Eating and drinking Eat healthy meals that include: Fresh fruits and vegetables. Whole grains. Good sources of protein, such as meat, eggs, or tofu. Low-fat dairy products. Avoid raw meat and unpasteurized juice, milk, and cheese. You may need to take these actions to prevent or treat trouble pooping: Drink enough fluids to keep  your pee (urine) pale yellow. Eat foods that are high in fiber. These include beans, whole grains, and fresh fruits and vegetables. Limit foods that are high in fat and sugar. These include fried or sweet foods. Activity Exercise only as told by your doctor. Most people can do their usual exercise during pregnancy. Try to exercise for 30 minutes at least 5 days a week. Stop exercising if you have pain or cramps in your belly or lower back. Do not exercise if it is too hot or too humid, or if you are in a place of great height (high altitude). Avoid heavy lifting. If you choose to, you may have sex unless your doctor tells you not to. Relieving pain and discomfort Wear a good support bra if your breasts are sore. Take warm water baths (sitz baths) to soothe pain or discomfort caused by hemorrhoids. Use hemorrhoid cream if your doctor approves. Rest with your legs raised (elevated) if you have leg cramps or low back pain. If you develop bulging veins in your legs: Wear support hose as told by your doctor. Raise your feet for 15 minutes, 3-4 times a day. Limit salt in your food. Safety Wear your seat belt at all times when you are in a car. Talk with your doctor if someone is hurting you or yelling at you a lot. Lifestyle Do not use hot tubs, steam rooms, or saunas. Do not douche. Do not use tampons or scented sanitary pads. Avoid cat litter boxes and soil used by cats. These carry germs that can harm your baby and   can cause a loss of your baby by miscarriage or stillbirth. Do not use herbal medicines, illegal drugs, or medicines that are not approved by your doctor. Do not drink alcohol. Do not smoke or use any products that contain nicotine or tobacco. If you need help quitting, ask your doctor. General instructions Keep all follow-up visits. This is important. Ask your doctor about local prenatal classes. Ask your doctor about the right foods to eat or for help finding a  counselor. Where to find more information American Pregnancy Association: americanpregnancy.org American College of Obstetricians and Gynecologists: www.acog.org Office on Women's Health: womenshealth.gov/pregnancy Contact a doctor if: You have a headache that does not go away when you take medicine. You have changes in how you see, or you see spots in front of your eyes. You have mild cramps, pressure, or pain in your lower belly. You continue to feel like you may vomit (nauseous), you vomit, or you have watery poop (diarrhea). You have bad-smelling fluid coming from your vagina. You have pain when you pee or your pee smells bad. You have very bad swelling of your face, hands, ankles, feet, or legs. You have a fever. Get help right away if: You are leaking fluid from your vagina. You have spotting or bleeding from your vagina. You have very bad belly cramping or pain. You have trouble breathing. You have chest pain. You faint. You have not felt your baby move for the time period told by your doctor. You have new or increased pain, swelling, or redness in an arm or leg. Summary The second trimester of pregnancy is from week 13 through week 27 (months 4 through 6). Eat healthy meals. Exercise as told by your doctor. Most people can do their usual exercise during pregnancy. Do not use herbal medicines, illegal drugs, or medicines that are not approved by your doctor. Do not drink alcohol. Call your doctor if you get sick or if you notice anything unusual about your pregnancy. This information is not intended to replace advice given to you by your health care provider. Make sure you discuss any questions you have with your health care provider. Document Revised: 10/16/2019 Document Reviewed: 08/22/2019 Elsevier Patient Education  2022 Elsevier Inc. Common Medications Safe in Pregnancy  Acne:      Constipation:  Benzoyl Peroxide     Colace  Clindamycin      Dulcolax  Suppository  Topica Erythromycin     Fibercon  Salicylic Acid      Metamucil         Miralax AVOID:        Senakot   Accutane    Cough:  Retin-A       Cough Drops  Tetracycline      Phenergan w/ Codeine if Rx  Minocycline      Robitussin (Plain & DM)  Antibiotics:     Crabs/Lice:  Ceclor       RID  Cephalosporins    AVOID:  E-Mycins      Kwell  Keflex  Macrobid/Macrodantin   Diarrhea:  Penicillin      Kao-Pectate  Zithromax      Imodium AD         PUSH FLUIDS AVOID:       Cipro     Fever:  Tetracycline      Tylenol (Regular or Extra  Minocycline       Strength)  Levaquin      Extra Strength-Do not            Exceed 8 tabs/24 hrs Caffeine:        <200mg/day (equiv. To 1 cup of coffee or  approx. 3 12 oz sodas)         Gas: Cold/Hayfever:       Gas-X  Benadryl      Mylicon  Claritin       Phazyme  **Claritin-D        Chlor-Trimeton    Headaches:  Dimetapp      ASA-Free Excedrin  Drixoral-Non-Drowsy     Cold Compress  Mucinex (Guaifenasin)     Tylenol (Regular or Extra  Sudafed/Sudafed-12 Hour     Strength)  **Sudafed PE Pseudoephedrine   Tylenol Cold & Sinus     Vicks Vapor Rub  Zyrtec  **AVOID if Problems With Blood Pressure         Heartburn: Avoid lying down for at least 1 hour after meals  Aciphex      Maalox     Rash:  Milk of Magnesia     Benadryl    Mylanta       1% Hydrocortisone Cream  Pepcid  Pepcid Complete   Sleep Aids:  Prevacid      Ambien   Prilosec       Benadryl  Rolaids       Chamomile Tea  Tums (Limit 4/day)     Unisom         Tylenol PM         Warm milk-add vanilla or  Hemorrhoids:       Sugar for taste  Anusol/Anusol H.C.  (RX: Analapram 2.5%)  Sugar Substitutes:  Hydrocortisone OTC     Ok in moderation  Preparation H      Tucks        Vaseline lotion applied to tissue with wiping    Herpes:     Throat:  Acyclovir      Oragel  Famvir  Valtrex     Vaccines:         Flu Shot Leg  Cramps:       *Gardasil  Benadryl      Hepatitis A         Hepatitis B Nasal Spray:       Pneumovax  Saline Nasal Spray     Polio Booster         Tetanus Nausea:       Tuberculosis test or PPD  Vitamin B6 25 mg TID   AVOID:    Dramamine      *Gardasil  Emetrol       Live Poliovirus  Ginger Root 250 mg QID    MMR (measles, mumps &  High Complex Carbs @ Bedtime    rebella)  Sea Bands-Accupressure    Varicella (Chickenpox)  Unisom 1/2 tab TID     *No known complications           If received before Pain:         Known pregnancy;   Darvocet       Resume series after  Lortab        Delivery  Percocet    Yeast:   Tramadol      Femstat  Tylenol 3      Gyne-lotrimin  Ultram       Monistat  Vicodin           MISC:         All Sunscreens             Hair Coloring/highlights          Insect Repellant's          (Including DEET)         Mystic Tans Tests and Screening During Pregnancy Having certain tests and screenings during pregnancy is an important part of your prenatal care. These tests help your health care provider find problems that might affect your pregnancy. Some tests must be done for all pregnant women, and some are optional. Most of the tests and screenings do not pose any risks for you or your baby. You may need additional testing if any routine tests indicate a problem. Tests and screenings done early in pregnancy Some tests and screenings you can expect to have in early pregnancy include: Blood tests, such as: Complete blood count (CBC). This test is done to check your red and white blood cells. It can help identify a risk for anemia, infection, or bleeding. Blood typing. This test shows your blood type. It also shows whether you have a certain protein in your red blood cells called the Rh factor. It can be dangerous for your baby if you do not have this protein (Rh negative) and your baby has it (Rh positive). Tests to check for diseases that can cause birth defects or can be  passed to your baby, such as: [place] measles (rubella) and chicken pox. The test indicates whether you are immune to these diseases. Hepatitis B and C. Human Immunodeficiency Virus (HIV). Syphilis. Zika virus, if you or your partner has traveled to an area where the virus occurs. Urine testing. This checks for sugar in your urine and for signs of infection. Blood pressure. This is to check for high blood pressure and preeclampsia. Testing for sexually transmitted infections (STIs), such as chlamydia or gonorrhea. Testing for tuberculosis. You may have this skin test if you are at risk for tuberculosis. Fetal ultrasound. This is an imaging study of your growing baby. It uses sound waves to create pictures of your baby. This test may be done to help determine your due date and to ensure you do not have an ectopic pregnancy. An ectopic pregnancy is a pregnancy that grows outside of the uterus. Tests and screenings done later in pregnancy Certain tests are done for the first time later in the pregnancy. Some of the tests that were done in early pregnancy are repeated at this time. Some common tests you can expect to have later in pregnancy include: Rh antibody testing. If you are Rh negative, you will have a blood test at about 28 weeks of pregnancy to see if you are producing Rh antibodies. If you have not started to make antibodies, you will be given an injection to prevent you from making antibodies for the rest of your pregnancy. Glucose screening. This checks your blood sugar. It will show whether you are developing the type of diabetes that occurs during pregnancy (gestational diabetes). You may have this screening earlier if you have risk factors for diabetes. Screening for group B streptococcus (GBS). GBS is a type of bacteria that may live in your rectum or vagina. GBS can spread to your baby during birth. This is done at 35-37 weeks of pregnancy. If testing is positive for GBS, you may be  treated with antibiotic medicine. Urine and blood tests to monitor for other pregnancy problems, such as preeclampsia or anemia. Blood pressure to monitor for high blood pressure and preeclampsia. Fetal ultrasound. This may be repeated at 16-20 weeks to check  how your baby is growing and developing. Non-stress test. This test is done later in pregnancy to check your baby's heart rate. This may be repeated weekly if your pregnancy is high risk. Biophysical profile. This test includes ultrasound imaging and a non-stress test to ensure your baby is healthy. This test may help decide when your baby should be born. Screening for birth defects Some birth defects are caused by abnormal genes passed down through families. Early in your pregnancy, tests can be done to find out if your baby is at risk for a genetic disorder. This testing is optional. The type of testing recommended for you will depend on your family and medical history, your ethnicity, and your age. Testing may include: Screening tests. These tests may include an ultrasound, blood tests, or a combination of both. The blood tests are used to check for abnormal genes and the ultrasound is done to look for early birth defects. Carrier screening. This test involves checking the blood or saliva of both parents to see if they carry abnormal genes that could be passed down to a baby. If genetic screening shows that your baby is at risk for a genetic defect, additional diagnostic testing may be recommended, such as: Amniocentesis. This involves testing a sample of fluid from your womb (amniotic fluid). Chorionic villus sampling. In this test, a sample of cells from your placenta is checked for abnormal cells. Unlike other tests done during pregnancy, diagnostic testing does have some risk for your pregnancy. Talk to your health care provider about the risks and benefits of genetic testing. Questions to ask your health care provider What routine tests  are recommended for me? When and how will these tests be done? When will I get the results of routine tests? What do the results of these tests mean for me or my baby? Do you recommend any genetic screening tests? Which ones? Should I see a genetic counselor before having genetic screening? Where to find more information American Pregnancy Association: americanpregnancy.org/prenatal-testing [employer]ians [employer] Gynecologists: JewelryExec.com.pt Office on [employer] Health: KeywordPortfolios.com.br March of Dimes: marchofdimes.org/pregnancy Summary Having certain tests and screenings during pregnancy is an important part of your prenatal care. Talk to your health care provider about what tests are right for you and your baby. In early pregnancy, testing may be done to check your risks for various conditions that can affect you and your baby. Later in pregnancy, tests may be done to ensure that your baby is growing normally and that you and your baby are staying healthy during the pregnancy. Genetic testing is optional. Talk to your health care provider about the risks and benefits of genetic testing. This information is not intended to replace advice given to you by your health care provider. Make sure you discuss any questions you have with your health care provider. Document Revised: 01/28/2020 Document Reviewed: 01/28/2020 Elsevier Patient Education  2022 [employer].

## 2021-02-05 ENCOUNTER — Other Ambulatory Visit: Payer: Self-pay

## 2021-02-05 ENCOUNTER — Ambulatory Visit (INDEPENDENT_AMBULATORY_CARE_PROVIDER_SITE_OTHER): Payer: BLUE CROSS/BLUE SHIELD | Admitting: Certified Nurse Midwife

## 2021-02-05 ENCOUNTER — Other Ambulatory Visit (HOSPITAL_COMMUNITY)
Admission: RE | Admit: 2021-02-05 | Discharge: 2021-02-05 | Disposition: A | Discharge status: Home / Self Care | Payer: BLUE CROSS/BLUE SHIELD | Source: Ambulatory Visit | Attending: Certified Nurse Midwife | Admitting: Certified Nurse Midwife

## 2021-02-05 ENCOUNTER — Encounter: Payer: Self-pay | Admitting: Certified Nurse Midwife

## 2021-02-05 VITALS — BP 106/66 | HR 81 | Wt 194.8 lb

## 2021-02-05 DIAGNOSIS — Z3A14 14 weeks gestation of pregnancy: Secondary | ICD-10-CM

## 2021-02-05 DIAGNOSIS — Z3402 Encounter for supervision of normal first pregnancy, second trimester: Secondary | ICD-10-CM | POA: Diagnosis present

## 2021-02-05 DIAGNOSIS — Z124 Encounter for screening for malignant neoplasm of cervix: Secondary | ICD-10-CM

## 2021-02-05 LAB — POCT URINALYSIS DIPSTICK OB
Bilirubin, UA: NEGATIVE
Blood, UA: NEGATIVE
Glucose, UA: NEGATIVE
Leukocytes, UA: NEGATIVE
Nitrite, UA: NEGATIVE
POC,PROTEIN,UA: NEGATIVE
Spec Grav, UA: 1.01 (ref 1.010–1.025)
Urobilinogen, UA: 0.2 E.U./dL
pH, UA: 7.5 (ref 5.0–8.0)

## 2021-02-05 MED ORDER — ASPIRIN EC 81 MG PO TBEC: 81.0000 mg | DELAYED_RELEASE_TABLET | Freq: Every day | ORAL | 11 refills | Status: DC

## 2021-02-05 NOTE — Progress Notes (Signed)
error 

## 2021-02-05 NOTE — Progress Notes (Signed)
NEW OB HISTORY AND PHYSICAL  SUBJECTIVE:       Gwendolyn Rogers is a 24 y.o. [id number] female, Patient's last menstrual period was 10/24/2020., Estimated Date of Delivery: 07/31/21, [redacted]w[redacted]d, presents today for establishment of Prenatal Care. She has no unusual complaints.   Body mass index is 36.57 kg/m.  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 Daily Denies smoking , drug use, and alcohol use.     Gynecologic History Patient's last menstrual period was 10/24/2020. Normal Contraception: none Last Pap: 06/27/2017. Results were: normal  Obstetric History OB History  Gravida Para Term Preterm AB Living  1 0 0 0 0 0  SAB IAB Ectopic Multiple Live Births  0 0 0 0 0    # Outcome Date GA Lbr Len/2nd Weight Sex Delivery Anes PTL Lv  1 Current             Past Medical History:  Diagnosis Date   Heavy periods    High blood cholesterol     Past Surgical History:  Procedure Laterality Date   LAPAROSCOPIC GASTRIC BAND REMOVAL WITH LAPAROSCOPIC GASTRIC SLEEVE RESECTION      Current Outpatient Medications on File Prior to Visit  Medication Sig Dispense Refill   Prenatal Vit-Fe Fumarate-FA (PRENATAL VITAMIN PO) Take by mouth.     No current facility-administered medications on file prior to visit.    No Known Allergies  Social History   Socioeconomic History   Marital status: Single    Spouse name: Not on file   Number of children: Not on file   Years of education: Not on file   Highest education level: Not on file  Occupational History   Not on file  Tobacco Use   Smoking status: Former    Types: E-cigarettes    Start date: 08/20/2018    Quit date: 12/21/2020    Years since quitting: 0.1    Passive exposure: Never   Smokeless tobacco: Never   Tobacco comments:    vapes every day  Vaping Use   Vaping Use: Former   Substances: Nicotine, Flavoring   Substance and Sexual Activity   Alcohol use: Not Currently    Comment: rare   Drug use: Never   Sexual activity: Not Currently    Birth control/protection: None  Other Topics Concern   Not on file  Social History Narrative   ** Merged History Encounter **       Social Determinants of Health   Financial Resource Strain: Not on file  Food Insecurity: Not on file  Transportation Needs: Not on file  Physical Activity: Not on file  Stress: Not on file  Social Connections: Not on file  Intimate Partner Violence: Not on file    Family History  Problem Relation Age of Onset   Breast cancer Neg Hx    Ovarian cancer Neg Hx    Colon cancer Neg Hx     The following portions of the patient's history were reviewed and updated as appropriate: allergies, current medications, past OB history, past medical history, past surgical history, past family history, past social history, and problem list.    OBJECTIVE: Initial Physical Exam (New OB)  GENERAL APPEARANCE: alert, well appearing, in no apparent distress, oriented to person, place and time, overweight HEAD: normocephalic, atraumatic MOUTH: mucous membranes moist, pharynx normal without lesions THYROID: no thyromegaly or masses present BREASTS: no masses noted, no  significant tenderness, no palpable axillary nodes, no skin changes LUNGS: clear to auscultation, no wheezes, rales or rhonchi, symmetric air entry HEART: regular rate and rhythm, no murmurs ABDOMEN: soft, nontender, nondistended, no abnormal masses, no epigastric pain, obese, and FHT present EXTREMITIES: no redness or tenderness in the calves or thighs, no edema, no limitation in range of motion, intact peripheral pulses SKIN: normal coloration and turgor, no rashes LYMPH NODES: no adenopathy palpable NEUROLOGIC: alert, oriented, normal speech, no focal findings or movement disorder noted  PELVIC EXAM EXTERNAL GENITALIA: normal appearing vulva with no masses, tenderness  or lesions VAGINA: no abnormal discharge or lesions CERVIX: no lesions or cervical motion tenderness, pap completed UTERUS: gravid ADNEXA: no masses palpable and nontender OB EXAM PELVIMETRY: appears adequate RECTUM: exam not indicated  ASSESSMENT: Normal pregnancy  PLAN: Prenatal care See orders New OB counseling: The patient has been given an overview regarding routine prenatal care. Recommendations regarding diet, weight gain, and exercise in pregnancy were given. Prenatal testing, optional genetic testing, carrier screening, and ultrasound use in pregnancy were reviewed.  Benefits of Breast Feeding were discussed. The patient is encouraged to consider nursing her baby post partum.   Doreene Burke, CNM

## 2021-02-05 NOTE — Progress Notes (Signed)
NOB-Pt present for initial prenatal care. Pt stated that she was doing well. Natera completed today.

## 2021-02-06 LAB — CBC
Hematocrit: 34.2 % (ref 34.0–46.6)
Hemoglobin: 11.4 g/dL (ref 11.1–15.9)
MCH: 30.2 pg (ref 26.6–33.0)
MCHC: 33.3 g/dL (ref 31.5–35.7)
MCV: 91 fL (ref 79–97)
Platelets: 310 10*3/uL (ref 150–450)
RBC: 3.78 x10E6/uL (ref 3.77–5.28)
RDW: 12.2 % (ref 11.7–15.4)
WBC: 9.7 10*3/uL (ref 3.4–10.8)

## 2021-02-09 ENCOUNTER — Other Ambulatory Visit: Payer: Self-pay

## 2021-02-09 ENCOUNTER — Emergency Department
Admission: EM | Admit: 2021-02-09 | Discharge: 2021-02-09 | Disposition: A | Discharge status: Home / Self Care | Payer: BLUE CROSS/BLUE SHIELD | Attending: Emergency Medicine | Admitting: Emergency Medicine

## 2021-02-09 DIAGNOSIS — S134XXA Sprain of ligaments of cervical spine, initial encounter: Secondary | ICD-10-CM | POA: Diagnosis not present

## 2021-02-09 DIAGNOSIS — Y92410 Unspecified street and highway as the place of occurrence of the external cause: Secondary | ICD-10-CM | POA: Diagnosis not present

## 2021-02-09 DIAGNOSIS — R109 Unspecified abdominal pain: Secondary | ICD-10-CM | POA: Insufficient documentation

## 2021-02-09 DIAGNOSIS — Z87891 Personal history of nicotine dependence: Secondary | ICD-10-CM | POA: Diagnosis not present

## 2021-02-09 DIAGNOSIS — Z3A10 10 weeks gestation of pregnancy: Secondary | ICD-10-CM | POA: Diagnosis not present

## 2021-02-09 DIAGNOSIS — O9A211 Injury, poisoning and certain other consequences of external causes complicating pregnancy, first trimester: Secondary | ICD-10-CM | POA: Insufficient documentation

## 2021-02-09 DIAGNOSIS — Z3491 Encounter for supervision of normal pregnancy, unspecified, first trimester: Secondary | ICD-10-CM

## 2021-02-09 LAB — CYTOLOGY - PAP: Diagnosis: NEGATIVE
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
## 2021-02-09 NOTE — Discharge Instructions (Addendum)
Take over-the-counter TYLENOL (Acetaminophen) as needed.  You can take 1000 mg (2 extra strength tablets) every 6 hours for pain
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
## 2021-02-12 ENCOUNTER — Other Ambulatory Visit: Payer: Self-pay | Admitting: Certified Nurse Midwife

## 2021-02-12 DIAGNOSIS — Z9889 Other specified postprocedural states: Secondary | ICD-10-CM

## 2021-02-12 DIAGNOSIS — Z3402 Encounter for supervision of normal first pregnancy, second trimester: Secondary | ICD-10-CM

## 2021-03-05 NOTE — Patient Instructions (Signed)

## 2021-03-08 ENCOUNTER — Ambulatory Visit (INDEPENDENT_AMBULATORY_CARE_PROVIDER_SITE_OTHER): Payer: BLUE CROSS/BLUE SHIELD | Admitting: Certified Nurse Midwife

## 2021-03-08 ENCOUNTER — Encounter: Payer: Self-pay | Admitting: Certified Nurse Midwife

## 2021-03-08 ENCOUNTER — Other Ambulatory Visit: Payer: Self-pay

## 2021-03-08 ENCOUNTER — Ambulatory Visit: Payer: BLUE CROSS/BLUE SHIELD

## 2021-03-08 VITALS — BP 107/67 | HR 92 | Wt 194.4 lb

## 2021-03-08 DIAGNOSIS — Z3402 Encounter for supervision of normal first pregnancy, second trimester: Secondary | ICD-10-CM

## 2021-03-08 DIAGNOSIS — Z3A19 19 weeks gestation of pregnancy: Secondary | ICD-10-CM

## 2021-03-08 LAB — POCT URINALYSIS DIPSTICK OB
Bilirubin, UA: NEGATIVE
Glucose, UA: NEGATIVE
Ketones, UA: NEGATIVE
Leukocytes, UA: NEGATIVE
Nitrite, UA: NEGATIVE
POC,PROTEIN,UA: NEGATIVE
Spec Grav, UA: 1.015 (ref 1.010–1.025)
Urobilinogen, UA: 0.2 E.U./dL
pH, UA: 7.5 (ref 5.0–8.0)

## 2021-03-08 NOTE — Progress Notes (Signed)
Rob doing well. Not feeling movement yet. Discussed labs due to hx gastric sleeve procedure. She is in agreement. Will follow up with results. Discussed anatomy next visit. She agrees. ROB in 5 wk anatomy scan and rob with Lorn Junes, CNM

## 2021-03-08 NOTE — Progress Notes (Signed)
OB-pt present for routine prenatal care. Pt stated that she was doing well. Pt declined flu vaccine.

## 2021-03-11 LAB — FERRITIN: Ferritin: 103 ng/mL (ref 15–150)

## 2021-03-11 LAB — VITAMIN B6: Vitamin B6: 13.7 ug/L (ref 3.4–65.2)

## 2021-03-11 LAB — VITAMIN D 25 HYDROXY (VIT D DEFICIENCY, FRACTURES): Vit D, 25-Hydroxy: 23.1 ng/mL — ABNORMAL LOW (ref 30.0–100.0)

## 2021-03-11 LAB — VITAMIN B12: Vitamin B-12: 212 pg/mL — ABNORMAL LOW (ref 232–1245)

## 2021-03-11 LAB — FOLATE: Folate: 12 ng/mL (ref >3.0)

## 2021-04-13 ENCOUNTER — Other Ambulatory Visit: Payer: Self-pay

## 2021-04-13 ENCOUNTER — Encounter: Payer: Self-pay | Admitting: Certified Nurse Midwife

## 2021-04-13 ENCOUNTER — Ambulatory Visit (INDEPENDENT_AMBULATORY_CARE_PROVIDER_SITE_OTHER): Payer: Self-pay | Admitting: Certified Nurse Midwife

## 2021-04-13 ENCOUNTER — Ambulatory Visit (INDEPENDENT_AMBULATORY_CARE_PROVIDER_SITE_OTHER): Payer: Self-pay

## 2021-04-13 VITALS — BP 126/75 | HR 88 | Wt 197.1 lb

## 2021-04-13 DIAGNOSIS — Z3402 Encounter for supervision of normal first pregnancy, second trimester: Secondary | ICD-10-CM

## 2021-04-13 DIAGNOSIS — Z3A20 20 weeks gestation of pregnancy: Secondary | ICD-10-CM

## 2021-04-13 LAB — POCT URINALYSIS DIPSTICK OB
Bilirubin, UA: NEGATIVE
Blood, UA: NEGATIVE
Glucose, UA: NEGATIVE
Ketones, UA: NEGATIVE
Leukocytes, UA: NEGATIVE
Nitrite, UA: NEGATIVE
POC,PROTEIN,UA: NEGATIVE
Spec Grav, UA: 1.02 (ref 1.010–1.025)
Urobilinogen, UA: 0.2 E.U./dL
pH, UA: 8 (ref 5.0–8.0)

## 2021-04-13 NOTE — Progress Notes (Signed)
ROB and u/s today for anatomy. Results discussed with pt. Pt has some swelling. Reviewed self help measures. Follow up 4 wks or prn.   Doreene Burke, CNM

## 2021-04-13 NOTE — Patient Instructions (Signed)
Round Ligament Pain The round ligaments are a pair of cord-like tissues that help support the uterus. They can become a source of pain during pregnancy as the ligaments soften and stretch as the baby grows. The pain usually begins in the second trimester (13-28 weeks) of pregnancy, and should only last for a few seconds when it occurs. However, the pain can come and go until the baby is delivered. The pain does not cause harm to the baby. Round ligament pain is usually a short, sharp, and pinching pain, but it can also be a dull, lingering, and aching pain. The pain is felt in the lower side of the abdomen or in the groin. It usually starts deep in the groin and moves up to the outside of the hip area. The pain may happen when you: Suddenly change position, such as quickly going from a sitting to standing position. Do physical activity. Cough or sneeze. Follow these instructions at home: Managing pain  When the pain starts, relax. Then, try any of these methods to help with the pain: Sit down. Flex your knees up to your abdomen. Lie on your side with one pillow under your abdomen and another pillow between your legs. Sit in a warm bath for 15-20 minutes or until the pain goes away. General instructions Watch your condition for any changes. Move slowly when you sit down or stand up. Stop or reduce your physical activities if they cause pain. Avoid long walks if they cause pain. Take over-the-counter and prescription medicines only as told by your health care provider. Keep all follow-up visits. This is important. Contact a health care provider if: Your pain does not go away with treatment. You feel pain in your back that you did not have before. Your medicine is not helping. You have a fever or chills. You have nausea or vomiting. You have diarrhea. You have pain when you urinate. Get help right away if: You have pain that is a rhythmic, cramping pain similar to labor pains. Labor pains  are usually 2 minutes apart, last for about 1 minute, and involve a bearing down feeling or pressure in your pelvis. You have vaginal bleeding. These symptoms may represent a serious problem that is an emergency. Do not wait to see if the symptoms will go away. Get medical help right away. Call your local emergency services (911 in the [place] Do not drive yourself to the hospital. Summary Round ligament pain is felt in the lower abdomen or groin. This pain usually begins in the second trimester (13-28 weeks) and should only last for a few seconds when it occurs. You may notice the pain when you suddenly change position, when you cough or sneeze, or during physical activity. Relaxing, flexing your knees to your abdomen, lying on one side, or taking a warm bath may help to get rid of the pain. Contact your health care provider if the pain does not go away. This information is not intended to replace advice given to you by your health care provider. Make sure you discuss any questions you have with your health care provider. Document Revised: 07/22/2020 Document Reviewed: 07/22/2020 Elsevier Patient Education  2022 Elsevier Inc.  

## 2021-05-18 ENCOUNTER — Encounter: Payer: Self-pay | Admitting: Certified Nurse Midwife

## 2021-05-18 ENCOUNTER — Other Ambulatory Visit: Payer: Self-pay

## 2021-05-18 ENCOUNTER — Ambulatory Visit (INDEPENDENT_AMBULATORY_CARE_PROVIDER_SITE_OTHER): Payer: BLUE CROSS/BLUE SHIELD | Admitting: Certified Nurse Midwife

## 2021-05-18 VITALS — BP 107/70 | HR 91 | Wt 203.2 lb

## 2021-05-18 DIAGNOSIS — Z3A25 25 weeks gestation of pregnancy: Secondary | ICD-10-CM

## 2021-05-18 DIAGNOSIS — Z3402 Encounter for supervision of normal first pregnancy, second trimester: Secondary | ICD-10-CM

## 2021-05-18 LAB — POCT URINALYSIS DIPSTICK OB
Bilirubin, UA: NEGATIVE
Blood, UA: NEGATIVE
Glucose, UA: NEGATIVE
Ketones, UA: NEGATIVE
Leukocytes, UA: NEGATIVE
Nitrite, UA: NEGATIVE
POC,PROTEIN,UA: NEGATIVE
Spec Grav, UA: 1.015 (ref 1.010–1.025)
Urobilinogen, UA: 0.2 E.U./dL
pH, UA: 7 (ref 5.0–8.0)

## 2021-05-18 NOTE — Progress Notes (Signed)
ROB doing well. Is concerned about weight gain asked about using protein shake in morning for meal replacement. Discussed wt gain in pregnancy. Encouraged healthy diet and regular exercise. Discussed glucose screen. Given pt history of gastric sleeve discussed alternative to 1 hr glucose screen. Pt states she feels like she should be able to use the normal Glucola testing and declines other options. Follow up 3 wk with Missy for ROB.   Doreene Burke, CNM

## 2021-05-18 NOTE — Patient Instructions (Signed)
Oral Glucose Tolerance Test During Pregnancy °Why am I having this test? °The oral glucose tolerance test (OGTT) is done to check how your body processes blood sugar (glucose). This is one of several tests used to diagnose diabetes that develops during pregnancy (gestational diabetes mellitus). Gestational diabetes is a short-term form of diabetes that some women develop while they are pregnant. It usually occurs during the second trimester of pregnancy and goes away after delivery. °Testing, or screening, for gestational diabetes usually occurs at weeks 24-28 of pregnancy. You may have the OGTT test after having a 1-hour glucose screening test if the results from that test indicate that you may have gestational diabetes. This test may also be needed if: °You have a history of gestational diabetes. °There is a history of giving birth to very large babies or of losing pregnancies (having stillbirths). °You have signs and symptoms of diabetes, such as: °Changes in your eyesight. °Tingling or numbness in your hands or feet. °Changes in hunger, thirst, and urination, and these are not explained by your pregnancy. °What is being tested? °This test measures the amount of glucose in your blood at different times during a period of 3 hours. This shows how well your body can process glucose. °What kind of sample is taken? °Blood samples are required for this test. They are usually collected by inserting a needle into a blood vessel. °How do I prepare for this test? °For 3 days before your test, eat normally. Have plenty of carbohydrate-rich foods. °Follow instructions from your health care provider about: °Eating or drinking restrictions on the day of the test. You may be asked not to eat or drink anything other than water (to fast) starting 8-10 hours before the test. °Changing or stopping your regular medicines. Some medicines may interfere with this test. °Tell a health care provider about: °All medicines you are taking,  including vitamins, herbs, eye drops, creams, and over-the-counter medicines. °Any blood disorders you have. °Any surgeries you have had. °Any medical conditions you have. °What happens during the test? °First, your blood glucose will be measured. This is referred to as your fasting blood glucose because you fasted before the test. Then, you will drink a glucose solution that contains a certain amount of glucose. Your blood glucose will be measured again 1, 2, and 3 hours after you drink the solution. °This test takes about 3 hours to complete. You will need to stay at the testing location during this time. During the testing period: °Do not eat or drink anything other than the glucose solution. °Do not exercise. °Do not use any products that contain nicotine or tobacco, such as cigarettes, e-cigarettes, and chewing tobacco. These can affect your test results. If you need help quitting, ask your health care provider. °The testing procedure may vary among health care providers and hospitals. °How are the results reported? °Your results will be reported as milligrams of glucose per deciliter of blood (mg/dL) or millimoles per liter (mmol/L). There is more than one source for screening and diagnosis reference values used to diagnose gestational diabetes. Your health care provider will compare your results to normal values that were established after testing a large group of people (reference values). Reference values may vary among labs and hospitals. For this test (Carpenter-Coustan), reference values are: °Fasting: 95 mg/dL (5.3 mmol/L). °1 hour: 180 mg/dL (10.0 mmol/L). °2 hour: 155 mg/dL (8.6 mmol/L). °3 hour: 140 mg/dL (7.8 mmol/L). °What do the results mean? °Results below the reference values are considered   normal. If two or more of your blood glucose levels are at or above the reference values, you may be diagnosed with gestational diabetes. If only one level is high, your health care provider may suggest  repeat testing or other tests to confirm a diagnosis. °Talk with your health care provider about what your results mean. °Questions to ask your health care provider °Ask your health care provider, or the department that is doing the test: °When will my results be ready? °How will I get my results? °What are my treatment options? °What other tests do I need? °What are my next steps? °Summary °The oral glucose tolerance test (OGTT) is one of several tests used to diagnose diabetes that develops during pregnancy (gestational diabetes mellitus). Gestational diabetes is a short-term form of diabetes that some women develop while they are pregnant. °You may have the OGTT test after having a 1-hour glucose screening test if the results from that test show that you may have gestational diabetes. You may also have this test if you have any symptoms or risk factors for this type of diabetes. °Talk with your health care provider about what your results mean. °This information is not intended to replace advice given to you by your health care provider. Make sure you discuss any questions you have with your health care provider. °Document Revised: 10/17/2019 Document Reviewed: 10/17/2019 °Elsevier Patient Education © 2022 Elsevier Inc. ° °

## 2021-05-23 NOTE — L&D Delivery Note (Signed)
? ? ?     Delivery Note  ? ?Gwendolyn Rogers is a 26 y.o. G1P0000 at [redacted]w[redacted]d Estimated Date of Delivery: 08/26/21 ? ?PRE-OPERATIVE DIAGNOSIS:  ?1) [redacted]w[redacted]d pregnancy.  ? ? ?POST-OPERATIVE DIAGNOSIS:  ?1) [redacted]w[redacted]d pregnancy s/p Vaginal, Spontaneous  ? ? ?Delivery Type: Vaginal, Spontaneous   ? ?Delivery Anesthesia: Epidural  ? ?Labor Complications:  PROM, Meconium stained amniotic fluid ?  ? ?ESTIMATED BLOOD LOSS: 520 ml   ? ?FINDINGS:   ?1) female infant, Apgar scores of 7   at 1 minute and 9   at 5 minutes and a birthweight of 107.23  ounces.   ? ?2) Nuchal cord: yes , reduced ? ?SPECIMENS:  ? PLACENTA: ?  Appearance: Intact , 3 vessel cord, cord blood sample collected ?  Removal: Spontaneous    ?  Disposition:  per protocol  ? ?DISPOSITION:  ?Infant to left in stable condition in the delivery room, with L&D personnel and mother, ? ?NARRATIVE SUMMARY: ?Labor course:  Ms. Gwendolyn Rogers is a G1P0000 at [redacted]w[redacted]d who presented for labor management.  She progressed well in labor with one dose of cytotec and pitocin.  She received the appropriate epidural anesthesia and proceeded to complete dilation. She evidenced good maternal expulsive effort during the second stage. She went on to deliver a viable female infant " Gwendolyn Rogers". ?The placenta delivered without problems and was noted to be complete. A perineal and vaginal examination was performed. Episiotomy/Lacerations: 1st degree vaginal, right periurethral ?The lacerations were repaired with3-0 Vicryl Rapide suture using.  ?The patient tolerated this well. ? ?Doreene Burke, CNM  ?08/17/2021 ?2:31 AM  ?

## 2021-06-09 ENCOUNTER — Other Ambulatory Visit: Payer: Self-pay

## 2021-06-09 ENCOUNTER — Ambulatory Visit (INDEPENDENT_AMBULATORY_CARE_PROVIDER_SITE_OTHER): Payer: BLUE CROSS/BLUE SHIELD | Admitting: Obstetrics

## 2021-06-09 ENCOUNTER — Encounter: Payer: Self-pay | Admitting: Certified Nurse Midwife

## 2021-06-09 VITALS — BP 113/73 | HR 87 | Wt 206.9 lb

## 2021-06-09 DIAGNOSIS — Z3A28 28 weeks gestation of pregnancy: Secondary | ICD-10-CM

## 2021-06-09 DIAGNOSIS — Z3403 Encounter for supervision of normal first pregnancy, third trimester: Secondary | ICD-10-CM

## 2021-06-09 NOTE — Progress Notes (Signed)
ROB at 105w6d. Feeling well; active baby. Denies ctx, LOF, vaginal bleeding. Discussed breastfeeding and making a birth plan. Does not want an epidural because of chronic pain. Discussed pain management options and recommended CBE and doula. Does not want hormonal BC PP. Reviewed non-hormonal options. Handout given. GTT, CBC, RPR, labs for gastric surgery today. RTC in 2 weeks.  Guadlupe Spanish, CNM

## 2021-06-11 LAB — CBC WITH DIFFERENTIAL/PLATELET
Basophils Absolute: 0.1 10*3/uL (ref 0.0–0.2)
Basos: 1 %
EOS (ABSOLUTE): 0.1 10*3/uL (ref 0.0–0.4)
Eos: 1 %
Hematocrit: 30.7 % — ABNORMAL LOW (ref 34.0–46.6)
Hemoglobin: 10.9 g/dL — ABNORMAL LOW (ref 11.1–15.9)
Immature Grans (Abs): 0.1 10*3/uL (ref 0.0–0.1)
Immature Granulocytes: 1 %
Lymphocytes Absolute: 1.6 10*3/uL (ref 0.7–3.1)
Lymphs: 18 %
MCH: 29.5 pg (ref 26.6–33.0)
MCHC: 35.5 g/dL (ref 31.5–35.7)
MCV: 83 fL (ref 79–97)
Monocytes Absolute: 0.5 10*3/uL (ref 0.1–0.9)
Monocytes: 6 %
Neutrophils Absolute: 6.8 10*3/uL (ref 1.4–7.0)
Neutrophils: 73 %
Platelets: 338 10*3/uL (ref 150–450)
RBC: 3.69 x10E6/uL — ABNORMAL LOW (ref 3.77–5.28)
RDW: 11.9 % (ref 11.7–15.4)
WBC: 9.1 10*3/uL (ref 3.4–10.8)

## 2021-06-11 LAB — CALCIUM: Calcium: 9 mg/dL (ref 8.7–10.2)

## 2021-06-11 LAB — IRON,TIBC AND FERRITIN PANEL
Ferritin: 14 ng/mL — ABNORMAL LOW (ref 15–150)
Iron Saturation: 12 % — ABNORMAL LOW (ref 15–55)
Iron: 54 ug/dL (ref 27–159)
Total Iron Binding Capacity: 442 ug/dL (ref 250–450)
UIBC: 388 ug/dL (ref 131–425)

## 2021-06-11 LAB — FOLATE: Folate: >20 ng/mL (ref >3.0)

## 2021-06-11 LAB — GLUCOSE, 1 HOUR GESTATIONAL: Gestational Diabetes Screen: 137 mg/dL (ref 70–139)

## 2021-06-11 LAB — VITAMIN B12: Vitamin B-12: 150 pg/mL — ABNORMAL LOW (ref 232–1245)

## 2021-06-11 LAB — VITAMIN B1: Thiamine: 117 nmol/L (ref 66.5–200.0)

## 2021-06-11 LAB — RPR: RPR Ser Ql: NONREACTIVE

## 2021-06-17 ENCOUNTER — Encounter: Payer: Self-pay | Admitting: Obstetrics

## 2021-06-17 ENCOUNTER — Other Ambulatory Visit: Payer: Self-pay | Admitting: Obstetrics

## 2021-06-17 MED ORDER — FUSION PLUS PO CAPS: 1.0000 | ORAL_CAPSULE | Freq: Every day | ORAL | 2 refills | Status: DC

## 2021-06-17 MED ORDER — B-50 COMPLEX PO TABS: 1.0000 | ORAL_TABLET | Freq: Every day | ORAL | 2 refills | Status: DC

## 2021-06-22 ENCOUNTER — Encounter: Payer: Self-pay | Admitting: Certified Nurse Midwife

## 2021-06-23 ENCOUNTER — Encounter: Payer: Self-pay | Admitting: Certified Nurse Midwife

## 2021-06-29 ENCOUNTER — Encounter: Payer: Self-pay | Admitting: Certified Nurse Midwife

## 2021-06-29 DIAGNOSIS — Z3403 Encounter for supervision of normal first pregnancy, third trimester: Secondary | ICD-10-CM

## 2021-06-29 DIAGNOSIS — Z3A31 31 weeks gestation of pregnancy: Secondary | ICD-10-CM

## 2021-06-30 ENCOUNTER — Ambulatory Visit (INDEPENDENT_AMBULATORY_CARE_PROVIDER_SITE_OTHER): Payer: Self-pay | Admitting: Certified Nurse Midwife

## 2021-06-30 ENCOUNTER — Other Ambulatory Visit: Payer: Self-pay

## 2021-06-30 VITALS — BP 122/74 | HR 101 | Wt 210.9 lb

## 2021-06-30 DIAGNOSIS — E611 Iron deficiency: Secondary | ICD-10-CM

## 2021-06-30 DIAGNOSIS — Z3A31 31 weeks gestation of pregnancy: Secondary | ICD-10-CM

## 2021-06-30 DIAGNOSIS — Z3403 Encounter for supervision of normal first pregnancy, third trimester: Secondary | ICD-10-CM

## 2021-06-30 DIAGNOSIS — Z9884 Bariatric surgery status: Secondary | ICD-10-CM

## 2021-06-30 LAB — POCT URINALYSIS DIPSTICK OB
Bilirubin, UA: NEGATIVE
Blood, UA: NEGATIVE
Glucose, UA: NEGATIVE
Ketones, UA: NEGATIVE
Leukocytes, UA: NEGATIVE
Nitrite, UA: NEGATIVE
POC,PROTEIN,UA: NEGATIVE
Spec Grav, UA: 1.015 (ref 1.010–1.025)
Urobilinogen, UA: 0.2 E.U./dL
pH, UA: 7 (ref 5.0–8.0)

## 2021-06-30 MED ORDER — CYANOCOBALAMIN 1000 MCG/ML IJ SOLN
1000.0000 ug | Freq: Once | INTRAMUSCULAR | Status: AC
  Administered 2021-06-30: 1000 ug via INTRAMUSCULAR

## 2021-06-30 NOTE — Progress Notes (Signed)
ROB doing well, Feeling good movement. Discussed u/s for growth due to history of gastric sleeve. Orders placed. Dose of IM B-12 give today, recommend repeat labs for B-12 next visit with Missy.    Doreene Burke, CNM

## 2021-06-30 NOTE — Patient Instructions (Signed)
[place] [facility]  [facility]  [address], [place], [place] [address]  Phone: [phone]  [facility] (second location)  [address] [place], [place] [address]  Phone: [phone]  [facility] ([place]) [address], [place], [place] [address] Phone: [phone]  [facility]  [address] , [place] [address]  Phone: [phone] 

## 2021-07-16 ENCOUNTER — Other Ambulatory Visit: Payer: Self-pay

## 2021-07-16 ENCOUNTER — Ambulatory Visit (INDEPENDENT_AMBULATORY_CARE_PROVIDER_SITE_OTHER): Payer: Self-pay | Admitting: Obstetrics

## 2021-07-16 ENCOUNTER — Ambulatory Visit (INDEPENDENT_AMBULATORY_CARE_PROVIDER_SITE_OTHER): Payer: Self-pay

## 2021-07-16 VITALS — BP 114/70 | HR 86 | Wt 215.0 lb

## 2021-07-16 DIAGNOSIS — Z3403 Encounter for supervision of normal first pregnancy, third trimester: Secondary | ICD-10-CM

## 2021-07-16 DIAGNOSIS — Z9884 Bariatric surgery status: Secondary | ICD-10-CM

## 2021-07-16 DIAGNOSIS — E538 Deficiency of other specified B group vitamins: Secondary | ICD-10-CM

## 2021-07-16 DIAGNOSIS — Z3A31 31 weeks gestation of pregnancy: Secondary | ICD-10-CM

## 2021-07-16 MED ORDER — CYANOCOBALAMIN 1000 MCG/ML IJ SOLN
1000.0000 ug | Freq: Once | INTRAMUSCULAR | Status: AC
  Administered 2021-07-16: 1000 ug via INTRAMUSCULAR

## 2021-07-16 NOTE — Progress Notes (Signed)
ROB at [redacted]w[redacted]d. Growth [place] today. Growth 70th pecentile, AC 98th percentile, AFI 20.8. Feeling good fetal movement. Having some [employer]. Denies vaginal bleeding and LOF. She has been having some vaginal discharge with no itching or odor. Declines a swab today. Discussed GBS, GC/chlamydia at next visit. RTC in 2 weeks.  Guadlupe Spanish, CNM

## 2021-07-30 ENCOUNTER — Ambulatory Visit (INDEPENDENT_AMBULATORY_CARE_PROVIDER_SITE_OTHER): Payer: Self-pay | Admitting: Obstetrics

## 2021-07-30 ENCOUNTER — Other Ambulatory Visit: Payer: Self-pay

## 2021-07-30 VITALS — BP 117/80 | HR 88 | Wt 217.5 lb

## 2021-07-30 DIAGNOSIS — Z3403 Encounter for supervision of normal first pregnancy, third trimester: Secondary | ICD-10-CM

## 2021-07-30 DIAGNOSIS — Z3A36 36 weeks gestation of pregnancy: Secondary | ICD-10-CM

## 2021-07-30 NOTE — Progress Notes (Signed)
ROB at [redacted]w[redacted]d. Active baby. Not getting much rest. Denies contractions, LOF, and vaginal bleeding. Getting ready at home - still needs car seat. Discussed labor support; she plans on laboring alone. Recommended volunteer doula. Reviewed when/how to go to L&D when in labor. GBS/GC chlamydia collected today. Declines B12 labs due to insurance issues. Encouraged to take OTC B12. RTC in one week. ? ?Gwendolyn Rogers Spanish, CNM ?

## 2021-08-01 LAB — STREP GP B NAA: Strep Gp B NAA: NEGATIVE

## 2021-08-02 ENCOUNTER — Encounter: Payer: Self-pay | Admitting: Obstetrics

## 2021-08-02 LAB — GC/CHLAMYDIA PROBE AMP
Chlamydia trachomatis, NAA: NEGATIVE
Neisseria Gonorrhoeae by PCR: NEGATIVE

## 2021-08-04 ENCOUNTER — Ambulatory Visit (INDEPENDENT_AMBULATORY_CARE_PROVIDER_SITE_OTHER): Payer: Self-pay | Admitting: Certified Nurse Midwife

## 2021-08-04 ENCOUNTER — Other Ambulatory Visit: Payer: Self-pay

## 2021-08-04 ENCOUNTER — Encounter: Payer: Self-pay | Admitting: Certified Nurse Midwife

## 2021-08-04 VITALS — BP 116/77 | HR 91 | Wt 221.4 lb

## 2021-08-04 DIAGNOSIS — Z3403 Encounter for supervision of normal first pregnancy, third trimester: Secondary | ICD-10-CM

## 2021-08-04 DIAGNOSIS — Z3A36 36 weeks gestation of pregnancy: Secondary | ICD-10-CM

## 2021-08-04 NOTE — Patient Instructions (Signed)

## 2021-08-04 NOTE — Progress Notes (Signed)
ROB doing well, feeling good movement. Discussed GBS results negative. Reviewed labor precautions. Herbal handout given. SVE per pt request 1/thick/high. Pt has some swelling BP today normal ,she denies symptoms of pre E. Follow up 1 wk with Missy for ROB.  ? ?Doreene Burke, CNM  ?

## 2021-08-13 ENCOUNTER — Other Ambulatory Visit: Payer: Self-pay | Admitting: Obstetrics

## 2021-08-13 ENCOUNTER — Ambulatory Visit (INDEPENDENT_AMBULATORY_CARE_PROVIDER_SITE_OTHER): Payer: Self-pay | Admitting: Obstetrics

## 2021-08-13 ENCOUNTER — Other Ambulatory Visit: Payer: Self-pay

## 2021-08-13 VITALS — BP 118/76 | HR 80 | Wt 219.9 lb

## 2021-08-13 DIAGNOSIS — Z3A38 38 weeks gestation of pregnancy: Secondary | ICD-10-CM

## 2021-08-13 DIAGNOSIS — Z3403 Encounter for supervision of normal first pregnancy, third trimester: Secondary | ICD-10-CM

## 2021-08-13 LAB — POCT URINALYSIS DIPSTICK OB

## 2021-08-13 NOTE — Progress Notes (Signed)
ROB at [redacted]w[redacted]d. Good fetal movement. Feeling like she has more energy now. She had a stomach bug last week but has recovered. Recommended IOL by [facility] for h/o gastric sleeve and BMI>40. Gwendolyn Rogers is in agreement. Reviewed what to expect at the hospital. IOL scheduled for 08/25/21 at [facility]. Information given about COVID testing and when/how to arrive. Discussed s/s of labor and when to go to the hospital. SVE today per pt request 1/50/-3. RTC in one week for ROB/NST. ? ?Lloyd Huger, CNM ?

## 2021-08-16 ENCOUNTER — Inpatient Hospital Stay: Payer: Medicaid Other | Admitting: Anesthesiology

## 2021-08-16 ENCOUNTER — Encounter: Payer: Self-pay | Admitting: Obstetrics and Gynecology

## 2021-08-16 ENCOUNTER — Other Ambulatory Visit: Payer: Self-pay

## 2021-08-16 ENCOUNTER — Inpatient Hospital Stay
Admission: EM | Admit: 2021-08-16 | Discharge: 2021-08-18 | DRG: 807 | Disposition: A | Discharge status: Home / Self Care | Payer: Medicaid Other | Attending: Certified Nurse Midwife | Admitting: Certified Nurse Midwife

## 2021-08-16 DIAGNOSIS — Z23 Encounter for immunization: Secondary | ICD-10-CM

## 2021-08-16 DIAGNOSIS — Z3A38 38 weeks gestation of pregnancy: Secondary | ICD-10-CM | POA: Diagnosis not present

## 2021-08-16 DIAGNOSIS — O26893 Other specified pregnancy related conditions, third trimester: Secondary | ICD-10-CM | POA: Diagnosis present

## 2021-08-16 DIAGNOSIS — O4292 Full-term premature rupture of membranes, unspecified as to length of time between rupture and onset of labor: Principal | ICD-10-CM | POA: Diagnosis present

## 2021-08-16 DIAGNOSIS — O99214 Obesity complicating childbirth: Secondary | ICD-10-CM | POA: Diagnosis present

## 2021-08-16 LAB — RUPTURE OF MEMBRANE (ROM)PLUS: Rom Plus: POSITIVE

## 2021-08-16 LAB — CBC
HCT: 35.9 % — ABNORMAL LOW (ref 36.0–46.0)
Hemoglobin: 11.9 g/dL — ABNORMAL LOW (ref 12.0–15.0)
MCH: 26.4 pg (ref 26.0–34.0)
MCHC: 33.1 g/dL (ref 30.0–36.0)
MCV: 79.6 fL — ABNORMAL LOW (ref 80.0–100.0)
Platelets: 258 10*3/uL (ref 150–400)
RBC: 4.51 MIL/uL (ref 3.87–5.11)
RDW: 13.2 % (ref 11.5–15.5)
WBC: 8.1 10*3/uL (ref 4.0–10.5)
nRBC: 0 % (ref 0.0–0.2)

## 2021-08-16 LAB — TYPE AND SCREEN
ABO/RH(D): O POS
Antibody Screen: NEGATIVE

## 2021-08-16 LAB — ABO/RH: ABO/RH(D): O POS

## 2021-08-16 MED ORDER — LACTATED RINGERS IV SOLN: 500.0000 mL | Freq: Once | INTRAVENOUS | Status: DC

## 2021-08-16 MED ORDER — PHENYLEPHRINE 40 MCG/ML (10ML) SYRINGE FOR IV PUSH (FOR BLOOD PRESSURE SUPPORT)
80.0000 ug | PREFILLED_SYRINGE | INTRAVENOUS | Status: DC | PRN
  Filled 2021-08-16: qty 10

## 2021-08-16 MED ORDER — TERBUTALINE SULFATE 1 MG/ML IJ SOLN: 0.2500 mg | Freq: Once | INTRAMUSCULAR | Status: DC | PRN

## 2021-08-16 MED ORDER — SOD CITRATE-CITRIC ACID 500-334 MG/5ML PO SOLN: 30.0000 mL | ORAL | Status: DC | PRN

## 2021-08-16 MED ORDER — LACTATED RINGERS IV SOLN
500.0000 mL | INTRAVENOUS | Status: DC | PRN
  Administered 2021-08-16 (×2): 500 mL via INTRAVENOUS

## 2021-08-16 MED ORDER — FENTANYL-BUPIVACAINE-NACL 0.5-0.125-0.9 MG/250ML-% EP SOLN
12.0000 mL/h | EPIDURAL | Status: DC | PRN
  Administered 2021-08-16: 12 mL/h via EPIDURAL

## 2021-08-16 MED ORDER — OXYTOCIN-SODIUM CHLORIDE 30-0.9 UT/500ML-% IV SOLN
1.0000 m[IU]/min | INTRAVENOUS | Status: DC
  Administered 2021-08-16: 2 m[IU]/min via INTRAVENOUS

## 2021-08-16 MED ORDER — ONDANSETRON HCL 4 MG/2ML IJ SOLN: 4.0000 mg | Freq: Four times a day (QID) | INTRAMUSCULAR | Status: DC | PRN

## 2021-08-16 MED ORDER — EPHEDRINE 5 MG/ML INJ
10.0000 mg | INTRAVENOUS | Status: DC | PRN
  Filled 2021-08-16: qty 2

## 2021-08-16 MED ORDER — LIDOCAINE HCL (PF) 1 % IJ SOLN
INTRAMUSCULAR | Status: DC | PRN
  Administered 2021-08-16: 1 mL via SUBCUTANEOUS

## 2021-08-16 MED ORDER — FENTANYL-BUPIVACAINE-NACL 0.5-0.125-0.9 MG/250ML-% EP SOLN
EPIDURAL | Status: AC
  Filled 2021-08-16: qty 250

## 2021-08-16 MED ORDER — DIPHENHYDRAMINE HCL 50 MG/ML IJ SOLN: 12.5000 mg | INTRAMUSCULAR | Status: DC | PRN

## 2021-08-16 MED ORDER — OXYTOCIN 10 UNIT/ML IJ SOLN
INTRAMUSCULAR | Status: AC
  Filled 2021-08-16: qty 2

## 2021-08-16 MED ORDER — AMMONIA AROMATIC IN INHA
RESPIRATORY_TRACT | Status: AC
  Filled 2021-08-16: qty 10

## 2021-08-16 MED ORDER — OXYTOCIN-SODIUM CHLORIDE 30-0.9 UT/500ML-% IV SOLN
2.5000 [IU]/h | INTRAVENOUS | Status: DC
  Administered 2021-08-17: 2.5 [IU]/h via INTRAVENOUS

## 2021-08-16 MED ORDER — MISOPROSTOL 200 MCG PO TABS
ORAL_TABLET | ORAL | Status: AC
  Filled 2021-08-16: qty 4

## 2021-08-16 MED ORDER — OXYTOCIN BOLUS FROM INFUSION: 333.0000 mL | Freq: Once | INTRAVENOUS | Status: AC

## 2021-08-16 MED ORDER — BUTORPHANOL TARTRATE 1 MG/ML IJ SOLN
1.0000 mg | INTRAMUSCULAR | Status: DC | PRN
  Administered 2021-08-16: 1 mg via INTRAVENOUS
  Filled 2021-08-16: qty 1

## 2021-08-16 MED ORDER — ACETAMINOPHEN 325 MG PO TABS
650.0000 mg | ORAL_TABLET | ORAL | Status: DC | PRN
  Administered 2021-08-17: 650 mg via ORAL
  Filled 2021-08-16: qty 2

## 2021-08-16 MED ORDER — LIDOCAINE-EPINEPHRINE (PF) 1.5 %-1:200000 IJ SOLN
INTRAMUSCULAR | Status: DC | PRN
  Administered 2021-08-16: 3 mL via EPIDURAL

## 2021-08-16 MED ORDER — MISOPROSTOL 50MCG HALF TABLET
50.0000 ug | ORAL_TABLET | ORAL | Status: DC
  Administered 2021-08-16: 50 ug via VAGINAL
  Filled 2021-08-16 (×2): qty 1

## 2021-08-16 MED ORDER — SODIUM CHLORIDE 0.9 % IV SOLN
INTRAVENOUS | Status: DC | PRN
  Administered 2021-08-16 (×2): 5 mL via EPIDURAL

## 2021-08-16 MED ORDER — LIDOCAINE HCL (PF) 1 % IJ SOLN: 30.0000 mL | INTRAMUSCULAR | Status: DC | PRN

## 2021-08-16 MED ORDER — LACTATED RINGERS IV SOLN: INTRAVENOUS | Status: DC

## 2021-08-16 MED ORDER — OXYTOCIN-SODIUM CHLORIDE 30-0.9 UT/500ML-% IV SOLN
INTRAVENOUS | Status: AC
  Administered 2021-08-17: 333 mL via INTRAVENOUS
  Filled 2021-08-16: qty 1000

## 2021-08-16 MED ORDER — LIDOCAINE HCL (PF) 1 % IJ SOLN
INTRAMUSCULAR | Status: AC
  Filled 2021-08-16: qty 30

## 2021-08-16 NOTE — Anesthesia Procedure Notes (Signed)
Epidural ?Patient location during procedure: OB ?Start time: 08/16/2021 6:50 PM ?End time: 08/16/2021 6:59 PM ? ?Staffing ?Anesthesiologist: Foye Deer, MD ?Performed: anesthesiologist  ? ?Preanesthetic Checklist ?Completed: patient identified, IV checked, site marked, risks and benefits discussed, surgical consent, monitors and equipment checked, pre-op evaluation and timeout performed ? ?Epidural ?Patient position: sitting ?Prep: ChloraPrep ?Patient monitoring: heart rate, continuous pulse ox and blood pressure ?Approach: midline ?Location: L3-L4 ?Injection technique: LOR saline ? ?Needle:  ?Needle type: Tuohy  ?Needle gauge: 18 G ?Needle length: 9 cm ?Needle insertion depth: 8 cm ?Catheter type: closed end ?Catheter size: 20 Guage ?Catheter at skin depth: 13 cm ?Test dose: negative and 1.5% lidocaine with Epi 1:200 K ? ?Assessment ?Events: blood not aspirated, injection not painful, no injection resistance and no paresthesia ? ?Additional Notes ?Reason for block:procedure for pain ? ? ? ?

## 2021-08-16 NOTE — Anesthesia Preprocedure Evaluation (Signed)
Anesthesia Evaluation  ?Patient identified by MRN, date of birth, ID band ?Patient awake ? ? ? ?Reviewed: ?Allergy & Precautions, NPO status , Patient's Chart, lab work & pertinent test results ? ?Airway ?Mallampati: III ? ?TM Distance: >3 FB ?Neck ROM: full ? ? ? Dental ?no notable dental hx. ? ?  ?Pulmonary ?neg pulmonary ROS, former smoker,  ?  ?Pulmonary exam normal ? ? ? ? ? ? ? Cardiovascular ?Exercise Tolerance: Good ?negative cardio ROS ?Normal cardiovascular exam ? ? ?  ?Neuro/Psych ?  ? GI/Hepatic ?negative GI ROS,   ?Endo/Other  ?Morbid obesity ? Renal/GU ?  ?negative genitourinary ?  ?Musculoskeletal ? ? Abdominal ?(+) + obese,   ?Peds ? Hematology ? ?(+) Blood dyscrasia, anemia ,   ?Anesthesia Other Findings ?Past Medical History: ?No date: Heavy periods ?No date: High blood cholesterol ? ?Past Surgical History: ?No date: LAPAROSCOPIC GASTRIC BAND REMOVAL WITH LAPAROSCOPIC GASTRIC  ?SLEEVE RESECTION ? ?BMI   ? Body Mass Index: 41.38 kg/m?  ?  ? ? Reproductive/Obstetrics ?(+) Pregnancy ? ?  ? ? ? ? ? ? ? ? ? ? ? ? ? ?  ?  ? ? ? ? ? ? ? ? ?Anesthesia Physical ?Anesthesia Plan ? ?ASA: 3 ? ?Anesthesia Plan: Epidural  ? ?Post-op Pain Management:   ? ?Induction:  ? ?PONV Risk Score and Plan:  ? ?Airway Management Planned:  ? ?Additional Equipment:  ? ?Intra-op Plan:  ? ?Post-operative Plan:  ? ?Informed Consent: I have reviewed the patients History and Physical, chart, labs and discussed the procedure including the risks, benefits and alternatives for the proposed anesthesia with the patient or authorized representative who has indicated his/her understanding and acceptance.  ? ? ? ? ? ?Plan Discussed with: Anesthesiologist ? ?Anesthesia Plan Comments:   ? ? ? ? ? ? ?Anesthesia Quick Evaluation ? ?

## 2021-08-16 NOTE — H&P (Signed)
? ? ? ?History and Physical ? ? ?HPI ? ?Gwendolyn Rogers is a 26 y.o. G1P0000 at [redacted]w[redacted]d Estimated Date of Delivery: 08/26/21 who is being admitted for labor management due to prom @ 0500 with meconium.  ? ? ?OB History ? ?OB History  ?Gravida Para Term Preterm AB Living  ?1 0 0 0 0 0  ?SAB IAB Ectopic Multiple Live Births  ?0 0 0 0 0  ?  ?# Outcome Date GA Lbr Len/2nd Weight Sex Delivery Anes PTL Lv  ?1 Current           ? ? ?PROBLEM LIST ? ?Pregnancy complications or risks: ?Patient Active Problem List  ? Diagnosis Date Noted  ? Labor and delivery, indication for care 09-10-2021  ? ? ?Prenatal labs and studies: ?ABO, Rh: --/--/PENDING 2022/09/11 1006) ?Antibody: PENDING 2022-09-11 1006) ?Rubella: 1.35 (08/25 1633) ?RPR: Non Reactive (01/18 1003)  ?HBsAg: Negative (08/25 1633)  ?HIV: Non Reactive (08/25 1633)  ?[id number] (03/10 [id number]) ? ? ?Past Medical History:  ?Diagnosis Date  ? Heavy periods   ? High blood cholesterol   ? ? ? ?Past Surgical History:  ?Procedure Laterality Date  ? LAPAROSCOPIC GASTRIC BAND REMOVAL WITH LAPAROSCOPIC GASTRIC SLEEVE RESECTION    ? ? ? ?Medications   ? ?Current Discharge Medication List  ?  ? ?CONTINUE these medications which have NOT CHANGED  ? Details  ?aspirin EC 81 MG tablet Take 1 tablet (81 mg total) by mouth daily. Swallow whole. ?Qty: 30 tablet, Refills: 11  ?  ?Prenatal Vit-Fe Fumarate-FA (PRENATAL VITAMIN PO) Take by mouth.  ?  ?Iron-FA-B Cmp-C-Biot-Probiotic (FUSION PLUS) CAPS Take 1 tablet by mouth daily. ?Qty: 60 capsule, Refills: 2  ?  ?  ? ? ? ?Allergies ? ?Patient has no known allergies. ? ?Review of Systems ? ?Constitutional: negative ?Eyes: negative ?Ears, nose, mouth, throat, and face: negative ?Respiratory: negative ?Cardiovascular: negative ?Gastrointestinal: negative ?Genitourinary:negative ?Integument/breast: negative ?Hematologic/lymphatic: negative ?Musculoskeletal:negative ?Neurological: negative ?Behavioral/Psych: negative ?Endocrine:  negative ?Allergic/Immunologic: negative ? ?Physical Exam ? ?BP 127/69   Pulse 81   Temp 98.6 ?F (37 ?C) (Oral)   Resp 18   Ht 5\' 1"  (1.549 m)   Wt 99.3 kg   LMP 10/24/2020   BMI 41.38 kg/m?  ? ?Lungs:  CTA B ?Cardio: RRR without M/R/G ?Abd: Soft, gravid, NT ?Presentation: cephalic ?EXT: No C/C/ 1+ Edema ?DTRs: 2+ B ?CERVIX: Dilation: 1.5 ?Effacement (%): 50 ?Cervical Position: Posterior ?Station: -3 ?Exam by:: M.Newton, RN ? ?See Prenatal records for more detailed PE. ? ?  ? ?FHR:  ?Baseline: 135 bpm, Variability: Good {> 6 bpm), Accelerations: Reactive, and Decelerations: Absent ? ?Toco: ?Uterine Contractions: irregular mild  ? ?Test Results ? ?Results for orders placed or performed during the hospital encounter of 09-10-21 (from the past 24 hour(s))  ?ROM Plus ([facility] only)     Status: None  ? Collection Time: 2021-09-10  9:03 AM  ?Result Value Ref Range  ? Rom Plus POSITIVE   ?Type and screen [facility]     Status: None (Preliminary result)  ? Collection Time: 2021-09-10 10:06 AM  ?Result Value Ref Range  ? ABO/RH(D) PENDING   ? Antibody Screen PENDING   ? Sample Expiration    ?  08/19/2021,2359 ?Performed at [facility], [address] [place], [place] [address] ?  ?CBC     Status: Abnormal  ? Collection Time: 09/10/2021 10:06 AM  ?Result Value Ref Range  ? WBC 8.1 4.0 - 10.5 K/uL  ?  RBC 4.51 3.87 - 5.11 MIL/uL  ? Hemoglobin 11.9 (L) 12.0 - 15.0 g/dL  ? HCT 35.9 (L) 36.0 - 46.0 %  ? MCV 79.6 (L) 80.0 - 100.0 fL  ? MCH 26.4 26.0 - 34.0 pg  ? MCHC 33.1 30.0 - 36.0 g/dL  ? RDW 13.2 11.5 - 15.5 %  ? Platelets 258 150 - 400 K/uL  ? nRBC 0.0 0.0 - 0.2 %  ? ?Group B Strep negative ? ?Assessment ? ? G1P0000 at [redacted]w[redacted]d Estimated Date of Delivery: 08/26/21  ?The fetus is reassuring.  ? ?Patient Active Problem List  ? Diagnosis Date Noted  ? Labor and delivery, indication for care 08/16/2021  ? ? ?Plan ? ?1. Admit to L&D :   ?2. EFM:-- Category 1 ?3. Stadol or Epidural if desired.   ?4.  Admission labs  ?5. Dr. Marcelline Mates notified of admission ? ?Philip Aspen, CNM  ?08/16/2021 ?11:52 AM  ?

## 2021-08-16 NOTE — Progress Notes (Signed)
LABOR NOTE  ? ?Gwendolyn Rogers 25 y.o.GP@ at [redacted]w[redacted]d ? ?SUBJECTIVE:  ?Feeling some cramping  ?Analgesia: IV pain meds ? ?OBJECTIVE:  ?BP (!) 142/71   Pulse 88   Temp 98.4 ?F (36.9 ?C) (Axillary)   Resp 18   Ht 5\' 1"  (1.549 m)   Wt 99.3 kg   LMP 10/24/2020   BMI 41.38 kg/m?  ?Total I/O ?In: 248.1 [I.V.:248.1] ?Out: -  ? ?She has shown cervical change. Per RN exam  ? ?SVE:   Dilation: 3 ?Effacement (%): 60, 70 ?Station: -2 ?Exam by:: M.Newton, RN ?CONTRACTIONS: regular, every 2 minutes ?FHR: Fetal heart tracing reviewed. Baseline: 120 bpm, Variability: Fair (1-6 bpm), Accelerations: Non-reactive but appropriate for gestational age, and Decelerations: Absent Category I ?  ? ?Labs: ?Lab Results  ?Component Value Date  ? WBC 8.1 08/16/2021  ? HGB 11.9 (L) 08/16/2021  ? HCT 35.9 (L) 08/16/2021  ? MCV 79.6 (L) 08/16/2021  ? PLT 258 08/16/2021  ? ? ?ASSESSMENT: ?1) Labor curve reviewed.   ?    Progress: Early latent labor. ?    Membranes: ruptured, meconium  ?     ?    ? ?Principal Problem: ?  Labor and delivery, indication for care ? ? ?PLAN: ?continue present management, given pt has made cervical change and is contracting q 2 min, will continue to monitor. Plan to re evaluate for cervical change in 2-3 hrs. If not change discussed additional medication to continue labor progress. Pt verbalizes understanding.  ? ?08/18/2021, CNM . ?08/16/2021 ?4:58 PM ? ?  ?

## 2021-08-16 NOTE — OB Triage Note (Signed)
Patient presented to Labor and delivery for evaluation. Gwendolyn Rogers reports having a gush of greenish fluid this morning at 0500 when she was up to the bathroom.  Denies vaginal bleeding, reports normal fetal movement and states she's only had a few contractions.   ?

## 2021-08-16 NOTE — Progress Notes (Signed)
LABOR NOTE  ? ?Gwendolyn Rogers 26 y.o.GP@ at [redacted]w[redacted]d ? ?SUBJECTIVE:  ?Very uncomfortable with contractions, breathing with them requesting additional pain medication.  ?Analgesia: IV pain meds ? ?OBJECTIVE:  ?BP (!) 142/71   Pulse 88   Temp 98.4 ?F (36.9 ?C) (Axillary)   Resp 18   Ht 5\' 1"  (1.549 m)   Wt 99.3 kg   LMP 10/24/2020   BMI 41.38 kg/m?  ?Total I/O ?In: 248.1 [I.V.:248.1] ?Out: -  ? ?She has shown cervical change. ?CERVIX: 5cm:  90%:   -2:   mid position:   soft ?SVE:   Dilation: 3 ?Effacement (%): 60, 70 ?Station: -2 ?Exam by:: M.Newton, RN ?CONTRACTIONS: regular, every 2-3 minutes ?FHR: Fetal heart tracing reviewed. Baseline: 130 bpm, Variability: Good {> 6 bpm), Accelerations: Reactive, and Decelerations: Early Category I ?  ? ?Labs: ?Lab Results  ?Component Value Date  ? WBC 8.1 08/16/2021  ? HGB 11.9 (L) 08/16/2021  ? HCT 35.9 (L) 08/16/2021  ? MCV 79.6 (L) 08/16/2021  ? PLT 258 08/16/2021  ? ? ?ASSESSMENT: ?1) Labor curve reviewed.   ?    Progress: Active phase labor. ?    Membranes: ruptured, meconium  ?     ?    ? ?Principal Problem: ?  Labor and delivery, indication for care ?Obesity in pregnancy  ? ?PLAN: ?continue present management. Epidural prn .   ? ? ?08/18/2021, CNM . ?08/16/2021 ?6:39 PM ? ?  ?

## 2021-08-16 NOTE — Progress Notes (Signed)
LABOR NOTE  ? ?Gwendolyn Rogers 25 y.o.GP@ at [redacted]w[redacted]d ? ?SUBJECTIVE:  ?Comfortable with epidural placement. Denies pressure or urge to push.  ?Analgesia: Epidural ? ?OBJECTIVE:  ?BP (!) 105/41   Pulse 84   Temp 98.9 ?F (37.2 ?C) (Oral)   Resp 18   Ht 5\' 1"  (1.549 m)   Wt 99.3 kg   LMP 10/24/2020   SpO2 98%   BMI 41.38 kg/m?  ?No intake/output data recorded. ? ?She has shown cervical change. ?CERVIX: 7 cm:  80%:   -2:   mid position:   soft ?SVE:   Dilation: 7 ?Effacement (%): 90 ?Station: -2 ?Exam by:: [email or web address] CNm ?CONTRACTIONS: difficult to evaluate when pt on her side ?FHR: Fetal heart tracing reviewed. Baseline: 140 bpm, Variability: Good {> 6 bpm), Accelerations: Reactive, and Decelerations: Early , possible late Category II ?  ? ?Labs: ?Lab Results  ?Component Value Date  ? WBC 8.1 08/16/2021  ? HGB 11.9 (L) 08/16/2021  ? HCT 35.9 (L) 08/16/2021  ? MCV 79.6 (L) 08/16/2021  ? PLT 258 08/16/2021  ? ? ?ASSESSMENT: ?1) Labor curve reviewed.   ?    Progress: Active phase labor. ?    Membranes: ruptured, meconium IUPC placed  ?     ?     ?Principal Problem: ?  Labor and delivery, indication for care ?Obesity  ? ?PLAN: ?Discussed with pt and family mininal cervical change and difficulty monitoring contractions. Suggest IUPC placement. PT and family in agreement. Augmentation with pitocin , titrated to adequate NVU's 200-250.  ? ?08/18/2021, CNM  ?08/16/2021 ?9:09 PM ? ?  ?

## 2021-08-17 ENCOUNTER — Encounter: Payer: Self-pay | Admitting: Certified Nurse Midwife

## 2021-08-17 DIAGNOSIS — E669 Obesity, unspecified: Secondary | ICD-10-CM

## 2021-08-17 DIAGNOSIS — Z3A38 38 weeks gestation of pregnancy: Secondary | ICD-10-CM

## 2021-08-17 DIAGNOSIS — O99214 Obesity complicating childbirth: Secondary | ICD-10-CM

## 2021-08-17 DIAGNOSIS — O4202 Full-term premature rupture of membranes, onset of labor within 24 hours of rupture: Secondary | ICD-10-CM

## 2021-08-17 LAB — CBC
HCT: 24.6 % — ABNORMAL LOW (ref 36.0–46.0)
Hemoglobin: 8.2 g/dL — ABNORMAL LOW (ref 12.0–15.0)
MCH: 26.7 pg (ref 26.0–34.0)
MCHC: 33.3 g/dL (ref 30.0–36.0)
MCV: 80.1 fL (ref 80.0–100.0)
Platelets: 274 10*3/uL (ref 150–400)
RBC: 3.07 MIL/uL — ABNORMAL LOW (ref 3.87–5.11)
RDW: 13.3 % (ref 11.5–15.5)
WBC: 20.1 10*3/uL — ABNORMAL HIGH (ref 4.0–10.5)
nRBC: 0 % (ref 0.0–0.2)

## 2021-08-17 LAB — RPR: RPR Ser Ql: NONREACTIVE

## 2021-08-17 MED ORDER — SENNOSIDES-DOCUSATE SODIUM 8.6-50 MG PO TABS
2.0000 | ORAL_TABLET | ORAL | Status: DC
  Administered 2021-08-17: 2 via ORAL
  Filled 2021-08-17: qty 2

## 2021-08-17 MED ORDER — OXYCODONE-ACETAMINOPHEN 5-325 MG PO TABS: 1.0000 | ORAL_TABLET | ORAL | Status: DC | PRN

## 2021-08-17 MED ORDER — OXYCODONE-ACETAMINOPHEN 5-325 MG PO TABS: 2.0000 | ORAL_TABLET | ORAL | Status: DC | PRN

## 2021-08-17 MED ORDER — FERROUS SULFATE 325 (65 FE) MG PO TABS
325.0000 mg | ORAL_TABLET | Freq: Every day | ORAL | Status: DC
  Administered 2021-08-17 – 2021-08-18 (×2): 325 mg via ORAL
  Filled 2021-08-17 (×2): qty 1

## 2021-08-17 MED ORDER — WITCH HAZEL-GLYCERIN EX PADS
1.0000 "application " | MEDICATED_PAD | CUTANEOUS | Status: DC | PRN
  Administered 2021-08-17 – 2021-08-18 (×2): 1 via TOPICAL
  Filled 2021-08-17 (×3): qty 100

## 2021-08-17 MED ORDER — ONDANSETRON HCL 4 MG/2ML IJ SOLN: 4.0000 mg | INTRAMUSCULAR | Status: DC | PRN

## 2021-08-17 MED ORDER — COCONUT OIL OIL
1.0000 "application " | TOPICAL_OIL | Status: DC | PRN
  Administered 2021-08-18: 1 via TOPICAL
  Filled 2021-08-17: qty 120

## 2021-08-17 MED ORDER — TETANUS-DIPHTH-ACELL PERTUSSIS 5-2.5-18.5 LF-MCG/0.5 IM SUSY
0.5000 mL | PREFILLED_SYRINGE | INTRAMUSCULAR | Status: AC | PRN
  Administered 2021-08-18: 0.5 mL via INTRAMUSCULAR
  Filled 2021-08-17 (×2): qty 0.5

## 2021-08-17 MED ORDER — PRENATAL MULTIVITAMIN CH
1.0000 | ORAL_TABLET | Freq: Every day | ORAL | Status: DC
  Administered 2021-08-17 – 2021-08-18 (×2): 1 via ORAL
  Filled 2021-08-17 (×2): qty 1

## 2021-08-17 MED ORDER — ACETAMINOPHEN 325 MG PO TABS
650.0000 mg | ORAL_TABLET | ORAL | Status: DC | PRN
Start: 2021-08-17 — End: 2021-08-18
  Administered 2021-08-18: 650 mg via ORAL
  Filled 2021-08-17: qty 2

## 2021-08-17 MED ORDER — METHYLERGONOVINE MALEATE 0.2 MG/ML IJ SOLN
0.2000 mg | INTRAMUSCULAR | Status: DC | PRN
  Filled 2021-08-17: qty 1

## 2021-08-17 MED ORDER — DOCUSATE SODIUM 100 MG PO CAPS
100.0000 mg | ORAL_CAPSULE | Freq: Two times a day (BID) | ORAL | Status: DC
  Administered 2021-08-18: 100 mg via ORAL
  Filled 2021-08-17: qty 1

## 2021-08-17 MED ORDER — SENNOSIDES-DOCUSATE SODIUM 8.6-50 MG PO TABS: 2.0000 | ORAL_TABLET | ORAL | Status: DC

## 2021-08-17 MED ORDER — IBUPROFEN 600 MG PO TABS
600.0000 mg | ORAL_TABLET | Freq: Four times a day (QID) | ORAL | Status: DC
  Administered 2021-08-17 (×3): 600 mg via ORAL
  Filled 2021-08-17 (×4): qty 1

## 2021-08-17 MED ORDER — ONDANSETRON HCL 4 MG PO TABS: 4.0000 mg | ORAL_TABLET | ORAL | Status: DC | PRN

## 2021-08-17 MED ORDER — DIBUCAINE (PERIANAL) 1 % EX OINT
1.0000 "application " | TOPICAL_OINTMENT | CUTANEOUS | Status: DC | PRN
  Administered 2021-08-18: 1 via RECTAL
  Filled 2021-08-17 (×2): qty 28

## 2021-08-17 MED ORDER — BENZOCAINE-MENTHOL 20-0.5 % EX AERO
1.0000 "application " | INHALATION_SPRAY | CUTANEOUS | Status: DC | PRN
  Administered 2021-08-18: 1 via TOPICAL
  Filled 2021-08-17 (×2): qty 56

## 2021-08-17 MED ORDER — METHYLERGONOVINE MALEATE 0.2 MG PO TABS
0.2000 mg | ORAL_TABLET | ORAL | Status: DC | PRN
  Filled 2021-08-17: qty 1

## 2021-08-17 MED ORDER — SIMETHICONE 80 MG PO CHEW: 80.0000 mg | CHEWABLE_TABLET | ORAL | Status: DC | PRN

## 2021-08-17 NOTE — Lactation Note (Addendum)
This note was copied from a baby's chart. ?Lactation Consultation Note ? ?Patient Name: Gwendolyn Rogers ?Today's Date: 08/17/2021 ?Reason for consult: Mother's request ?Age:26 hours ? ?Maternal Data ?Does the patient have breastfeeding experience prior to this delivery?: No ? ?Feeding ?Mother's feeding choice is breast milk and formula. ? ?LATCH Score ?Latch: Repeated attempts needed to sustain latch, nipple held in mouth throughout feeding, stimulation needed to elicit sucking reflex. ? ?Audible Swallowing: Spontaneous and intermittent ? ?Type of Nipple: Everted at rest and after stimulation ? ?Comfort (Breast/Nipple): Soft / non-tender (Mom left nipple is slightly inverted but with hand expression and/or use of manual pump nipple erects and baby can latch.) ? ?Hold (Positioning): Assistance needed to correctly position infant at breast and maintain latch. ? ?LATCH Score: 8 ? ? ?Lactation Tools Discussed/Used ? Provided mom with manual pump to erect nipple to use as needed. Reviewed use, cleaning of parts , and storage of breastmilk. ? ?Interventions ?Interventions: Breast feeding basics reviewed;Hand express;Breast compression;Adjust position;Support pillows;Pre-pump if needed;Breast massage;Assisted with latch;Education ? ?Discharge ?  ? ?Consult Status ?Consult Status: Follow-up ?Date: 08/18/21 ?Follow-up type: In-patient ? ? ? ?[name] ?08/17/2021, 3:59 PM ? ? ? ?

## 2021-08-17 NOTE — Lactation Note (Addendum)
This note was copied from a baby's chart. ?Lactation Consultation Note ? ?Patient Name: Gwendolyn Rogers ?Today's Date: 08/17/2021 ?Reason for consult: Initial assessment;Early term 37-38.6wks ?Age:26 hours ? ?Upon [employer] student entering room, baby sleeping in crib, Mom laying in bed, maternal grandmother on the phone in the chair, and maternal sister sitting in chair.  ? ?P1 mom reported that feeding is "going good." Baby had 33 mL formula bottle ~20 minutes prior to visit. Mom plans to breastfeed and formula feed. [facility] student discussed pumping breast milk and feeding back to baby. Mom declined DEBP and manual hand pump. She wants to wait to start pumping.  ? ?Reviewed feeding frequency, stomach size, intake/output, stool changes, milk production, cluster feeding, feeding cues, and skin to skin.  ? ?Mom has not selected a pediatrician. Mom stated she is active in the [facility] program. [facility] student encouraged mom to reach out to [facility] to notify them that she has given birth. ? ?Per maternal grandmother, Mom asked about "cream" for nipples, if they crack. Discussed correct latching and nipple crack prevention. LC suggested expressing milk onto nipple, coconut oil, or olive oil. Mom denies nipple pain or breast pain.  ? ?Feeding plan ?1 - Feed baby on demand 8+/24 hour period.  ?2 - Feed baby at the breast first then offer formula, if needed.  ?3 - Continue skin to skin.   ?4 - All questions and concerns answered.      ? ?Maternal Data ?Does the patient have breastfeeding experience prior to this delivery?: No ? ?Feeding ?Mother's Current Feeding Choice: Breast Milk and Formula  ? ?Interventions ?Interventions: Breast feeding basics reviewed;Education ? ?Discharge ?WIC Program: Yes ? ?Consult Status ?Consult Status: Follow-up ?Date: 08/18/21 ?Follow-up type: In-patient ? ? ? ?Shana Chute, lactation student ?08/17/2021, 11:30 AM ? ?Lonzo Cloud, RN, IBCLC ? ?

## 2021-08-18 ENCOUNTER — Encounter: Payer: Self-pay | Admitting: Certified Nurse Midwife

## 2021-08-18 NOTE — Lactation Note (Signed)
This note was copied from a baby's chart. ?Lactation Consultation Note ? ?Patient Name: Gwendolyn Rogers ?Today's Date: 08/18/2021 ?Reason for consult: Follow-up assessment ?Age:26 hours ? ?Maternal Data ? ?Follow-up today with first time mom who reports baby stopped latching this morning. Per mom baby latches, detaches, and cries at the breast. Mom is combination feeding by choice . She had been breastfeeding and post breastfeed offering formula with slow flow nipple. Mom reports baby taking the formula well. Mom's breasts are filling and per mom her nipples have everted.  ? ?Has patient been taught Hand Expression?: Yes ?Does the patient have breastfeeding experience prior to this delivery?: No ? ?Feeding ?Mother's Current Feeding Choice: Breast Milk and Formula ? ? ?Interventions ?Interventions: Breast feeding basics reviewed;Pre-pump if needed;Hand pump;Education (Reviewed breastfeeding basics, optimizing latch technique, and referenced Enjoy mother-baby book in anticipation of discharge to home today.) ?Discussed with mom baby may have become flow preferenced. Provided mom with tips and strategies to encourage baby to sustain latch and breastfeed. Recommended mom hand express or prepump to elicit let-down to encourage baby to sustain latch. Also, recommended breast massage and compression to increase flow and encourage baby to feed at the breast. Mom understands if baby does not latch and feed to express milk to encourage her milk supply to grow. ?Discussed with mom once her milk volume increases the baby may be more agreeable to complete a full feeding at the breast. Encouraged mom to request [facility] outpatient assistance if she continues to have difficulty once her milk volume increases. ? ?Discharge ?Discharge Education: Engorgement and breast care;Warning signs for feeding baby (Provided mom with information regarding outpatient lactation assistance at [facility].) ?Pump: Manual (Mom provided manual pump  yesterday.) ? ?Consult Status ?Consult Status: Complete ?Date: 08/18/21 ? ?Update provided to care nurse Skyler. ? ?[name] ?08/18/2021, 10:50 AM ? ? ? ?

## 2021-08-18 NOTE — Final Progress Note (Signed)
Date of Service 08/18/21 ?  ?                         ?Patient Name: Gwendolyn Rogers ?DOB: Jan 17, 1996 ?MRN: [id number] ?  ?Date of admission: 08/16/2021 ?Delivery date:08/17/2021  ?Delivering provider: Philip Aspen  ?Date of discharge: 08/18/2021 ?  ?Admitting diagnosis: Labor and delivery, indication for care [O75.9] ?Intrauterine pregnancy: [redacted]w[redacted]d    ?Secondary diagnosis:  Principal Problem: ?  Labor and delivery, indication for care ?  ?Additional problems: None                           ?Discharge diagnosis: Term Pregnancy Delivered                                              ?Post partum procedures: none ?Augmentation: Pitocin and Cytotec ?Complications: None ?  ?Hospital course: Onset of Labor With Vaginal Delivery      ?26y.o. yo G1P1001 at 374w5das admitted in Latent Labor on 08/16/2021. Patient had an uncomplicated labor course as follows:  ?Membrane Rupture Time/Date: 5:00 AM ,08/16/2021   ?Delivery Method:Vaginal, Spontaneous  ?Episiotomy: None  ?Lacerations:  1st degree;Periurethral;Vaginal  ?Patient had an uncomplicated postpartum course.  She is ambulating, tolerating a regular diet, passing flatus, and urinating well. Patient is discharged home in stable condition on 08/18/21. ?  ?Newborn Data: ?Birth date:08/17/2021  ?Birth time:1:46 AM  ?Gender:Female  ?Living status:Living  ?Apgars:7 ,9  ?Weight:3040 g  ?  ?Magnesium Sulfate received: No ?BMZ received: No ?Rhophylac:N/A ?MMR:N/A ?T-DaP:Given postpartum ?Flu: N/A ?Transfusion:No ?  ?Physical exam  ?      ?Vitals:  ?  08/17/21 1705 08/17/21 1936 08/17/21 2304 08/18/21 0745  ?BP: (!) 112/59 (!) 96/59 121/68 112/68  ?Pulse: 91 90 91 80  ?Resp: 18 18 20 17   ?Temp: 98.3 ?F (36.8 ?C) 98.4 ?F (36.9 ?C) 98.8 ?F (37.1 ?C) 98.2 ?F (36.8 ?C)  ?TempSrc: Oral Oral Oral Oral  ?SpO2:   99% 100% 100%  ?Weight:          ?Height:          ?  ?General: alert, cooperative, and no distress ?Lochia: appropriate ?Uterine Fundus: firm ?Lacerations: Healing well ?DVT  Evaluation: No evidence of DVT seen on physical exam. ?Labs: ?Recent Labs  ?     ?Lab Results  ?Component Value Date  ?  WBC 20.1 (H) 08/17/2021  ?  HGB 8.2 (L) 08/17/2021  ?  HCT 24.6 (L) 08/17/2021  ?  MCV 80.1 08/17/2021  ?  PLT 274 08/17/2021  ?  ?  ?  ?  Latest Ref Rng & Units 06/09/2021  ? 10:03 AM  ?CMP  ?Calcium 8.7 - 10.2 mg/dL 9.0    ?  ?Edinburgh Score: ?  ?  08/17/2021  ? 12:00 PM  ?Edinburgh Postnatal Depression Scale Screening Tool  ?I have been able to laugh and see the funny side of things. 0  ?I have looked forward with enjoyment to things. 0  ?I have blamed myself unnecessarily when things went wrong. 0  ?I have been anxious or worried for no good reason. 0  ?I have felt scared or panicky for no good reason. 0  ?Things have been getting on top of me. 1  ?I have been so unhappy  that I have had difficulty sleeping. 0  ?I have felt sad or miserable. 0  ?I have been so unhappy that I have been crying. 0  ?The thought of harming myself has occurred to me. 0  ?Edinburgh Postnatal Depression Scale Total 1  ?  ?  ?  ?  ?After visit meds:  ?  ?Discharge home in stable condition ?Infant Feeding: Breast ?Infant Disposition:home with mother ?Discharge instruction: per After Visit Summary and Postpartum booklet. ?Activity: Advance as tolerated. Pelvic rest for 6 weeks.  ?Diet: routine diet ?Anticipated Birth Control:  NFP ?Postpartum Appointment: 2 week video visit, 6 week office visit ?Additional Postpartum F/U:  PRN ?Future Appointments:No future appointments. ?Follow up Visit: ?  Follow-up Information   ?  ?  [facility] [place]. Schedule an appointment as soon as possible for a visit in 6 week(s).   ?Contact information: ?[address]  [address] ?[place] ?[phone] ?  ?   ?  ?  ?   ?  ?  ?   ?  ?  ?  ?  ?  ?08/18/2021 ?Lurlean Horns, CNM ?  ? ?

## 2021-08-18 NOTE — Anesthesia Postprocedure Evaluation (Signed)
Anesthesia Post Note ? ?Patient: [name] ? ?Procedure(s) Performed: AN AD HOC LABOR EPIDURAL ? ?Patient location during evaluation: Mother Baby ?Anesthesia Type: Epidural ?Level of consciousness: awake, oriented and awake and alert ?Pain management: pain level controlled ?Vital Signs Assessment: post-procedure vital signs reviewed and stable ?Respiratory status: spontaneous breathing, respiratory function stable and nonlabored ventilation ?Cardiovascular status: stable ?Postop Assessment: no headache and no backache ?Anesthetic complications: no ? ? ?No notable events documented. ? ? ?Last Vitals:  ?Vitals:  ? 08/17/21 1936 08/17/21 2304  ?BP: (!) 96/59 121/68  ?Pulse: 90 91  ?Resp: 18 20  ?Temp: 36.9 ?C 37.1 ?C  ?SpO2: 99% 100%  ?  ?Last Pain:  ?Vitals:  ? 08/18/21 0640  ?TempSrc:   ?PainSc: 0-No pain  ? ? ?  ?  ?  ?  ?  ?  ? ?Judeth Cornfield Willa Brocks ? ? ? ? ?

## 2021-08-18 NOTE — Progress Notes (Signed)
Patient discharged home with family.  Discharge instructions, when to follow up, and prescriptions reviewed with patient.  Patient verbalized understanding. Patient will be escorted out by auxiliary.   

## 2021-08-18 NOTE — Discharge Summary (Signed)
? ?  Postpartum Discharge Summary ? ?Date of Service 08/18/21 ? ?   ?Patient Name: Gwendolyn Rogers ?DOB: 06/16/1995 ?MRN: [id number] ? ?Date of admission: 08/16/2021 ?Delivery date:08/17/2021  ?Delivering provider: Philip Aspen  ?Date of discharge: 08/18/2021 ? ?Admitting diagnosis: Labor and delivery, indication for care [O75.9] ?Intrauterine pregnancy: [redacted]w[redacted]d    ?Secondary diagnosis:  Principal Problem: ?  Labor and delivery, indication for care ? ?Additional problems: None    ?Discharge diagnosis: Term Pregnancy Delivered                                              ?Post partum procedures: none ?Augmentation: Pitocin and Cytotec ?Complications: None ? ?Hospital course: Onset of Labor With Vaginal Delivery      ?26y.o. yo G1P1001 at 332w5das admitted in Latent Labor on 08/16/2021. Patient had an uncomplicated labor course as follows:  ?Membrane Rupture Time/Date: 5:00 AM ,08/16/2021   ?Delivery Method:Vaginal, Spontaneous  ?Episiotomy: None  ?Lacerations:  1st degree;Periurethral;Vaginal  ?Patient had an uncomplicated postpartum course.  She is ambulating, tolerating a regular diet, passing flatus, and urinating well. Patient is discharged home in stable condition on 08/18/21. ? ?Newborn Data: ?Birth date:08/17/2021  ?Birth time:1:46 AM  ?Gender:Female  ?Living status:Living  ?Apgars:7 ,9  ?Weight:3040 g  ? ?Magnesium Sulfate received: No ?BMZ received: No ?Rhophylac:N/A ?MMR:N/A ?T-DaP:Given postpartum ?Flu: N/A ?Transfusion:No ? ?Physical exam  ?Vitals:  ? 08/17/21 1705 08/17/21 1936 08/17/21 2304 08/18/21 0745  ?BP: (!) 112/59 (!) 96/59 121/68 112/68  ?Pulse: 91 90 91 80  ?Resp: 18 18 20 17   ?Temp: 98.3 ?F (36.8 ?C) 98.4 ?F (36.9 ?C) 98.8 ?F (37.1 ?C) 98.2 ?F (36.8 ?C)  ?TempSrc: Oral Oral Oral Oral  ?SpO2:  99% 100% 100%  ?Weight:      ?Height:      ? ?General: alert, cooperative, and no distress ?Lochia: appropriate ?Uterine Fundus: firm ?Lacerations: Healing well ?DVT Evaluation: No evidence of DVT seen  on physical exam. ?Labs: ?Lab Results  ?Component Value Date  ? WBC 20.1 (H) 08/17/2021  ? HGB 8.2 (L) 08/17/2021  ? HCT 24.6 (L) 08/17/2021  ? MCV 80.1 08/17/2021  ? PLT 274 08/17/2021  ? ? ?  Latest Ref Rng & Units 06/09/2021  ? 10:03 AM  ?CMP  ?Calcium 8.7 - 10.2 mg/dL 9.0    ? ?Edinburgh Score: ? ?  08/17/2021  ? 12:00 PM  ?Edinburgh Postnatal Depression Scale Screening Tool  ?I have been able to laugh and see the funny side of things. 0  ?I have looked forward with enjoyment to things. 0  ?I have blamed myself unnecessarily when things went wrong. 0  ?I have been anxious or worried for no good reason. 0  ?I have felt scared or panicky for no good reason. 0  ?Things have been getting on top of me. 1  ?I have been so unhappy that I have had difficulty sleeping. 0  ?I have felt sad or miserable. 0  ?I have been so unhappy that I have been crying. 0  ?The thought of harming myself has occurred to me. 0  ?Edinburgh Postnatal Depression Scale Total 1  ? ? ? ? ?After visit meds:  ? ?Discharge home in stable condition ?Infant Feeding: Breast ?Infant Disposition:home with mother ?Discharge instruction: per After Visit Summary and Postpartum booklet. ?Activity: Advance as tolerated. Pelvic rest  for 6 weeks.  ?Diet: routine diet ?Anticipated Birth Control:  NFP ?Postpartum Appointment: 2 week video visit, 6 week office visit ?Additional Postpartum F/U:  PRN ?Future Appointments:No future appointments. ?Follow up Visit: ? Follow-up Information   ? ? [facility]. Schedule an appointment as soon as possible for a visit in 6 week(s).   ?Contact information: ?[address]  [address] ?[place] ?[phone] ? ?  ?  ? ?  ?  ? ?  ? ? ? ?  ? ?08/18/2021 ?Lurlean Horns, CNM ? ? ?

## 2021-08-18 NOTE — Discharge Instructions (Signed)

## 2021-08-25 ENCOUNTER — Encounter: Payer: Self-pay | Admitting: Obstetrics

## 2021-08-30 ENCOUNTER — Telehealth (INDEPENDENT_AMBULATORY_CARE_PROVIDER_SITE_OTHER): Payer: Self-pay | Admitting: Certified Nurse Midwife

## 2021-08-30 DIAGNOSIS — Z1331 Encounter for screening for depression: Secondary | ICD-10-CM

## 2021-08-30 NOTE — Progress Notes (Signed)
Virtual Visit via Video Note ? ?I connected with Gwendolyn Rogers on 08/30/21 at  8:45 AM EDT by a video enabled telemedicine application and verified that I am speaking with the correct person using two identifiers. ? ?Location: ?Patient: at home ?Provider: at office ?  ?I discussed the limitations of evaluation and management by telemedicine and the availability of in person appointments. The patient expressed understanding and agreed to proceed. ? ?History of Present Illness: ?Status post SVD @ 38.5 weeks x 2 wk  ?  ?Observations/Objective: ?Doing well, states bleeding is light and she is not having any pain. She is having some difficulty with milk supply . States that it has decreased and that she is giving baby more formula. She has been pumping every hour. States baby is not latching well so she is pumping and feeling breat milk from bottle. She denies any issues with her mood. Pt has not completed postnatal mood screen on line. Encouraged her to complete.  ? ?Assessment and Plan: ?Order placed for referral lactation. No signs /symptoms of postpartum depression.  ? ?Follow Up Instructions: ?As scheduled in 4 wks .  ?  ?I discussed the assessment and treatment plan with the patient. The patient was provided an opportunity to ask questions and all were answered. The patient agreed with the plan and demonstrated an understanding of the instructions. ?  ?The patient was advised to call back or seek an in-person evaluation if the symptoms worsen or if the condition fails to improve as anticipated. ? ?I provided 7 minutes of non-face-to-face time during this encounter. ? ? ?Philip Aspen, CNM ? ? ?

## 2021-09-27 ENCOUNTER — Encounter: Payer: Self-pay | Admitting: Certified Nurse Midwife

## 2021-09-29 ENCOUNTER — Encounter: Payer: Self-pay | Admitting: Certified Nurse Midwife

## 2021-09-30 ENCOUNTER — Encounter: Payer: Self-pay | Admitting: Certified Nurse Midwife

## 2021-10-26 ENCOUNTER — Ambulatory Visit (INDEPENDENT_AMBULATORY_CARE_PROVIDER_SITE_OTHER): Payer: Self-pay | Admitting: Certified Nurse Midwife

## 2021-10-26 ENCOUNTER — Encounter: Payer: Self-pay | Admitting: Certified Nurse Midwife

## 2021-10-26 DIAGNOSIS — Z30017 Encounter for initial prescription of implantable subdermal contraceptive: Secondary | ICD-10-CM

## 2021-10-26 DIAGNOSIS — Z3040 Encounter for surveillance of contraceptives, unspecified: Secondary | ICD-10-CM

## 2021-10-26 DIAGNOSIS — Z3202 Encounter for pregnancy test, result negative: Secondary | ICD-10-CM

## 2021-10-26 LAB — POCT URINE PREGNANCY: Preg Test, Ur: NEGATIVE

## 2021-10-26 NOTE — Patient Instructions (Addendum)
Nexplanon Instructions After Insertion  Keep bandage clean and dry for 24 hours  May use ice/Tylenol/Ibuprofen for soreness or pain  If you develop fever, drainage or increased warmth from incision site-contact office immediately  Preventive Care 51-26 Years Old, Female Preventive care refers to lifestyle choices and visits with your health care provider that can promote health and wellness. Preventive care visits are also called wellness exams. What can I expect for my preventive care visit? Counseling During your preventive care visit, your health care provider may ask about your: Medical history, including: Past medical problems. Family medical history. Pregnancy history. Current health, including: Menstrual cycle. Method of birth control. Emotional well-being. Home life and relationship well-being. Sexual activity and sexual health. Lifestyle, including: Alcohol, nicotine or tobacco, and drug use. Access to firearms. Diet, exercise, and sleep habits. Work and work Statistician. Sunscreen use. Safety issues such as seatbelt and bike helmet use. Physical exam Your health care provider may check your: Height and weight. These may be used to calculate your BMI (body mass index). BMI is a measurement that tells if you are at a healthy weight. Waist circumference. This measures the distance around your waistline. This measurement also tells if you are at a healthy weight and may help predict your risk of certain diseases, such as type 2 diabetes and high blood pressure. Heart rate and blood pressure. Body temperature. Skin for abnormal spots. What immunizations do I need?  Vaccines are usually given at various ages, according to a schedule. Your health care provider will recommend vaccines for you based on your age, medical history, and lifestyle or other factors, such as travel or where you work. What tests do I need? Screening Your health care provider may recommend screening  tests for certain conditions. This may include: Pelvic exam and Pap test. Lipid and cholesterol levels. Diabetes screening. This is done by checking your blood sugar (glucose) after you have not eaten for a while (fasting). Hepatitis B test. Hepatitis C test. HIV (human immunodeficiency virus) test. STI (sexually transmitted infection) testing, if you are at risk. BRCA-related cancer screening. This may be done if you have a family history of breast, ovarian, tubal, or peritoneal cancers. Talk with your health care provider about your test results, treatment options, and if necessary, the need for more tests. Follow these instructions at home: Eating and drinking  Eat a healthy diet that includes fresh fruits and vegetables, whole grains, lean protein, and low-fat dairy products. Take vitamin and mineral supplements as recommended by your health care provider. Do not drink alcohol if: Your health care provider tells you not to drink. You are pregnant, may be pregnant, or are planning to become pregnant. If you drink alcohol: Limit how much you have to 0-1 drink a day. Know how much alcohol is in your drink. In the [place] one drink equals one 12 oz bottle of beer (355 mL), one 5 oz glass of wine (148 mL), or one 1 oz glass of hard liquor (44 mL). Lifestyle Brush your teeth every morning and night with fluoride toothpaste. Floss one time each day. Exercise for at least 30 minutes 5 or more days each week. Do not use any products that contain nicotine or tobacco. These products include cigarettes, chewing tobacco, and vaping devices, such as e-cigarettes. If you need help quitting, ask your health care provider. Do not use drugs. If you are sexually active, practice safe sex. Use a condom or other form of protection to prevent STIs. If you  do not wish to become pregnant, use a form of birth control. If you plan to become pregnant, see your health care provider for a prepregnancy visit. Find  healthy ways to manage stress, such as: Meditation, yoga, or listening to music. Journaling. Talking to a trusted person. Spending time with friends and family. Minimize exposure to UV radiation to reduce your risk of skin cancer. Safety Always wear your seat belt while driving or riding in a vehicle. Do not drive: If you have been drinking alcohol. Do not ride with someone who has been drinking. If you have been using any mind-altering substances or drugs. While texting. When you are tired or distracted. Wear a helmet and other protective equipment during sports activities. If you have firearms in your house, make sure you follow all gun safety procedures. Seek help if you have been physically or sexually abused. What's next? Go to your health care provider once a year for an annual wellness visit. Ask your health care provider how often you should have your eyes and teeth checked. Stay up to date on all vaccines. This information is not intended to replace advice given to you by your health care provider. Make sure you discuss any questions you have with your health care provider. Document Revised: 11/04/2020 Document Reviewed: 11/04/2020 Elsevier Patient Education  [place].

## 2021-10-26 NOTE — Progress Notes (Signed)
Subjective:    Gwendolyn Rogers is a 26 y.o. G10P1001 female who presents for a postpartum visit. She is 6 weeks postpartum following a spontaneous vaginal delivery at 38.5 gestational weeks. Anesthesia: epidural. I have fully reviewed the prenatal and intrapartum course. Postpartum course has been normal. Baby's course has been normal. Baby is feeding by breast. Bleeding no bleeding. Bowel function is normal. Bladder function is normal. Patient is not sexually active. Contraception method is  nexplanon . Postpartum depression screening: negative. Score 3.  Last pap 02/05/2021 and was normal.  The following portions of the patient's history were reviewed and updated as appropriate: allergies, current medications, past medical history, past surgical history and problem list.  Review of Systems Pertinent items are noted in HPI.   Vitals:   10/26/21 0944  BP: 106/72  Pulse: 69  Weight: 201 lb (91.2 kg)  Height: 5\' 1"  (1.549 m)   No LMP recorded. (Menstrual status: Lactating).  Objective:   General:  alert, cooperative and no distress   Breasts:  deferred, no complaints  Lungs: clear to auscultation bilaterally  Heart:  regular rate and rhythm  Abdomen: soft, nontender   Vulva: normal  Vagina: normal vagina  Cervix:  closed  Corpus: Well-involuted  Adnexa:  Non-palpable  Rectal Exam: no hemorrhoids        Assessment:   Postpartum exam 6 wks s/p SVD Breastfeeding Depression screening Contraception counseling   Plan:  : Nexplanon, placed today ( see note) Follow up in: 9 months for annual or earlier if needed  , CNM

## 2021-10-26 NOTE — Progress Notes (Signed)
Gwendolyn Rogers is a 26 y.o. year old female here for Nexplanon insertion.  No LMP recorded. (Menstrual status: Lactating)., last sexual intercourse was prior to delivery, and her pregnancy test today was negative.  Risks/benefits/side effects of Nexplanon have been discussed and her questions have been answered.  Specifically, a failure rate of 05/998 has been reported, with an increased failure rate if pt takes St. John's Wort and/or antiseizure medicaitons.  Gwendolyn Rogers is aware of the common side effect of irregular bleeding, which the incidence of decreases over time.  BP 106/72   Pulse 69   Ht 5\' 1"  (1.549 m)   Wt 201 lb (91.2 kg)   Breastfeeding Yes   BMI 37.98 kg/m   No results found for this or any previous visit (from the past 24 hour(s)).   She is right-handed, so her left arm, approximately 4 inches proximal from the elbow, was cleansed with alcohol and anesthetized with 2cc of 2% Lidocaine.  The area was cleansed again with betadine and the Nexplanon was inserted per manufacturer's recommendations without difficulty.  A steri-strip and pressure bandage were applied.  Pt was instructed to keep the area clean and dry, remove pressure bandage in 24 hours, and keep insertion site covered with the steri-strip for 3-5 days.  Back up contraception was recommended for 2 weeks.  She was given a card indicating date Nexplanon was inserted and date it needs to be removed. Follow-up PRN problems.  Makinzie Considine,CNM

## 2021-10-26 NOTE — Progress Notes (Signed)
EPDS - 3

## 2022-07-18 ENCOUNTER — Other Ambulatory Visit: Payer: Self-pay

## 2023-02-23 ENCOUNTER — Ambulatory Visit (INDEPENDENT_AMBULATORY_CARE_PROVIDER_SITE_OTHER): Payer: BC Managed Care – PPO | Admitting: Certified Nurse Midwife

## 2023-02-23 ENCOUNTER — Encounter: Payer: Self-pay | Admitting: Certified Nurse Midwife

## 2023-02-23 VITALS — BP 102/27 | HR 71 | Ht 61.0 in | Wt 222.0 lb

## 2023-02-23 DIAGNOSIS — Z01419 Encounter for gynecological examination (general) (routine) without abnormal findings: Secondary | ICD-10-CM

## 2023-02-23 NOTE — Patient Instructions (Signed)

## 2023-02-23 NOTE — Progress Notes (Signed)
GYNECOLOGY ANNUAL PREVENTATIVE CARE ENCOUNTER NOTE  History:     Gwendolyn Rogers is a 27 y.o. G72P1001 female here for a routine annual gynecologic exam.  Current complaints: none.   Denies abnormal vaginal bleeding, discharge, pelvic pain, problems with intercourse or other gynecologic concerns.     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 Patient has had an implant. Contraception: Nexplanon, wants to remove Last Pap: 02/05/2021. Results were: normal  Last mammogram: n/a .    Upstream - 02/23/23 0842       Pregnancy Intention Screening   Does the patient want to become pregnant in the next year? Yes    Does the patient's partner want to become pregnant in the next year? Yes    Would the patient like to discuss contraceptive options today? No      Contraception Wrap Up   Current Method Hormonal Implant    End Method Hormonal Implant    Contraception Counseling Provided Yes            The pregnancy intention screening data noted above was reviewed. Potential methods of contraception were discussed. The patient elected to proceed with Hormonal Implant.   Obstetric History OB History  Gravida Para Term Preterm AB Living  1 1 1  0 0 1  SAB IAB Ectopic Multiple Live Births  0 0 0 0 1    # Outcome Date GA Lbr Len/2nd Weight Sex Type Anes PTL Lv  1 Term 08/17/21 [redacted]w[redacted]d / 00:32 6 lb 11.2 oz (3.04 kg) F Vag-Spont EPI  LIV    Past Medical History:  Diagnosis Date   Heavy periods    High blood cholesterol     Past Surgical History:  Procedure Laterality Date   LAPAROSCOPIC GASTRIC BAND REMOVAL WITH LAPAROSCOPIC GASTRIC SLEEVE RESECTION      Current Outpatient Medications on File Prior to Visit  Medication Sig Dispense Refill   Etonogestrel (NEXPLANON [place]) Inject into the skin.     No current  facility-administered medications on file prior to visit.    No Known Allergies  Social History:  reports that she quit smoking about 2 years ago. Her smoking use included e-cigarettes. She started smoking about 4 years ago. She has never been exposed to tobacco smoke. She has never used smokeless tobacco. She reports that she does not currently use alcohol. She reports that she does not use drugs.  Family History  Problem Relation Age of Onset   Breast cancer Neg Hx    Ovarian cancer Neg Hx    Colon cancer Neg Hx     The following portions of the patient's history were reviewed and updated as appropriate: allergies, current medications, past family history, past medical history, past social history, past surgical history and problem list.  Review of Systems Pertinent items noted in HPI and remainder of comprehensive ROS otherwise negative.  Physical Exam:  BP (!) 102/27   Pulse 71   Ht 5\' 1"  (1.549 m)   Wt 222 lb (100.7 kg)   Breastfeeding No   BMI 41.95 kg/m  CONSTITUTIONAL: Well-developed,obese,  well-nourished female in no acute distress.  HENT:  Normocephalic, atraumatic, External right and left ear normal. Oropharynx is clear and moist EYES: Conjunctivae and EOM are normal. Pupils are equal, round, and reactive to light. No scleral icterus.  NECK: Normal range  of motion, supple, no masses.  Normal thyroid.  SKIN: Skin is warm and dry. No rash noted. Not diaphoretic. No erythema. No pallor. MUSCULOSKELETAL: Normal range of motion. No tenderness.  No cyanosis, clubbing, or edema.  2+ distal pulses. NEUROLOGIC: Alert and oriented to person, place, and time. Normal reflexes, muscle tone coordination.  PSYCHIATRIC: Normal mood and affect. Normal behavior. Normal judgment and thought content. CARDIOVASCULAR: Normal heart rate noted, regular rhythm RESPIRATORY: Clear to auscultation bilaterally. Effort and breath sounds normal, no problems with respiration noted. BREASTS:  Symmetric in size. No masses, tenderness, skin changes, nipple drainage, or lymphadenopathy bilaterally.  ABDOMEN: Soft, no distention noted.  No tenderness, rebound or guarding.  PELVIC: Normal appearing external genitalia and urethral meatus; normal appearing vaginal mucosa and cervix.  No abnormal discharge noted.  Pap smear not due.  Normal uterine size, no other palpable masses, no uterine or adnexal tenderness.  .   Assessment and Plan:    1. Women's annual routine gynecological examination  Pap: not due until 2025 Mammogram : n/a  Labs: none  Refills: none Referral:none.  Pt would like to remove nexplanon to conceive.  Routine preventative health maintenance measures emphasized. Please refer to After Visit Summary for other counseling recommendations.     Follow prn for nexplanon on removal  Doreene Burke, CNM [facility]  [facility],  [facility]

## 2023-02-24 ENCOUNTER — Encounter: Payer: Self-pay | Admitting: Certified Nurse Midwife

## 2023-02-24 ENCOUNTER — Ambulatory Visit: Payer: BC Managed Care – PPO | Admitting: Certified Nurse Midwife

## 2023-02-24 VITALS — BP 125/83 | HR 78 | Wt 219.1 lb

## 2023-02-24 DIAGNOSIS — Z3046 Encounter for surveillance of implantable subdermal contraceptive: Secondary | ICD-10-CM | POA: Diagnosis not present

## 2023-02-24 NOTE — Progress Notes (Signed)
year old No obstetric history on file. Hispanic female here for Nexplanon removal .  She was given informed consent for removal  of her Nexplanon. Her Nexplanon was placed 10/26/2021, No LMP recorded. Patient has had an implant.  There were no vitals taken for this visit. No LMP recorded. Patient has had an implant. No results found for this or any previous visit (from the past 24 hour(s)).   Appropriate time out taken. Nexplanon site identified.  Area prepped in usual sterile fashon. Two cc's of 2% lidocaine was used to anesthetize the area. A small stab incision was made right beside the implant on the distal portion.  The Nexplanon rod was grasped using hemostats and removed intact without difficulty.   Steri-strips and a pressure bandage was applied.  There was less than 3 cc blood loss. There were no complications.  The patient tolerated the procedure well.  She was instructed to keep the area clean and dry, remove pressure bandage in 24 hours, and keep  site covered with the steri-strips for 3-5 days.  She plan pregnancy in the near future. Discussed PNV, healthy diet, exercise , and avoidance of teratogens.   Follow-up PRN problems.  Doreene Burke, CNM

## 2023-02-24 NOTE — Patient Instructions (Signed)
Nexplanon Instructions After Care  Keep bandage clean and dry for 24 hours  May use ice/Tylenol/Ibuprofen for soreness or pain  If you develop fever, drainage or increased warmth from incision site-contact office immediately

## 2023-03-16 IMAGING — US US OB COMP LESS 14 WK
1 series · 14 of 28 positions shown · non-contrast
Comparison: No prior.

CLINICAL DATA: Possible pregnancy.

EXAM:
OBSTETRIC <14 WK ULTRASOUND
TECHNIQUE: Transabdominal ultrasound was performed for evaluation of the
gestation as well as the maternal uterus and adnexal regions.

[Series 1: us ob comp less 14 wks · 66 acquisitions, 14 frames shown]
[im 3/66]
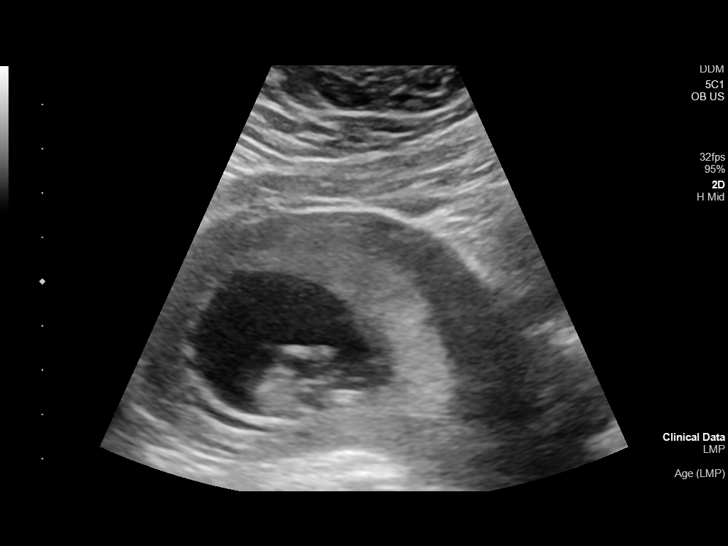
[im 8/66]
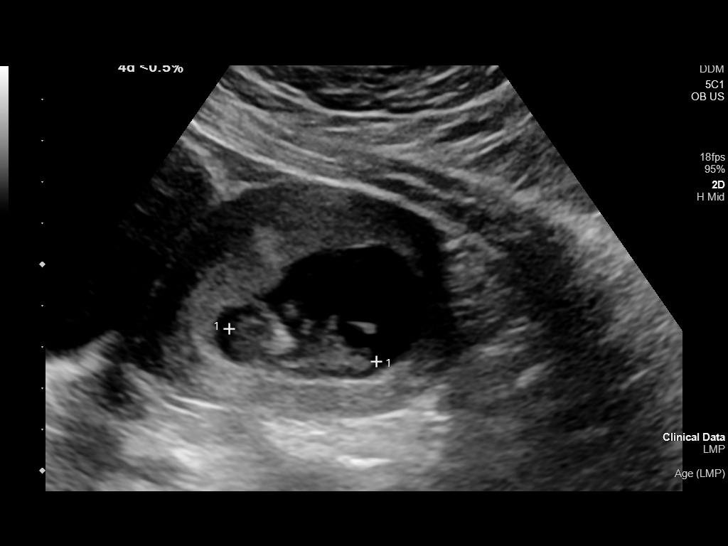
[im 13/66]
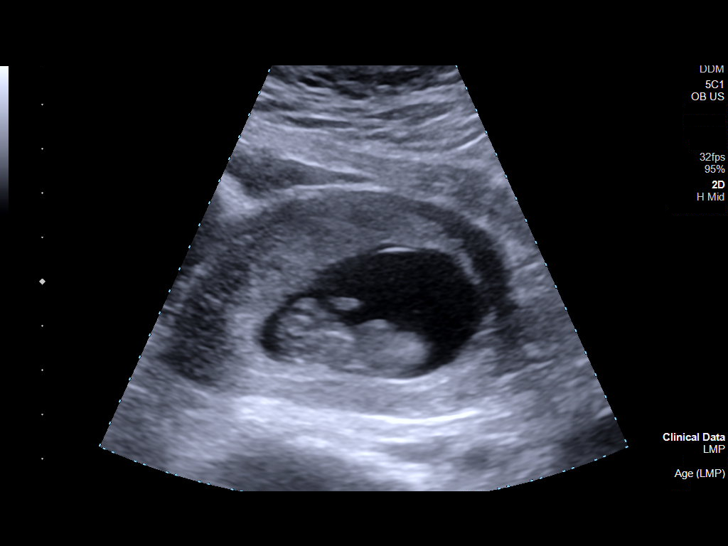
[im 17/66]
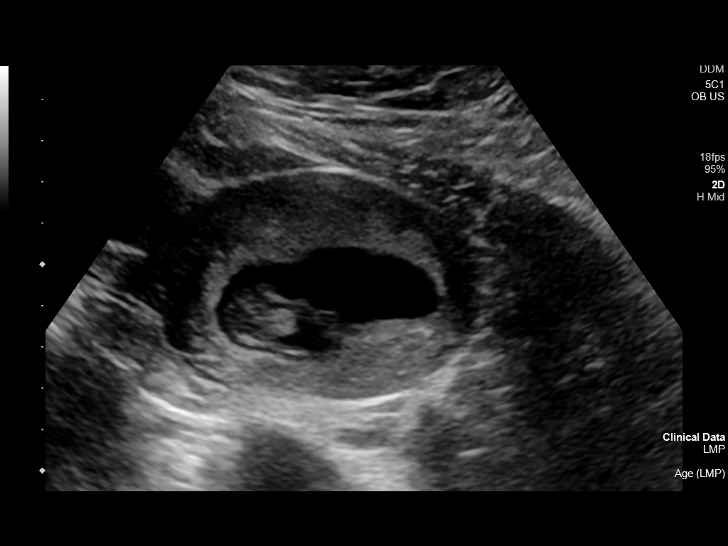
[im 22/66]
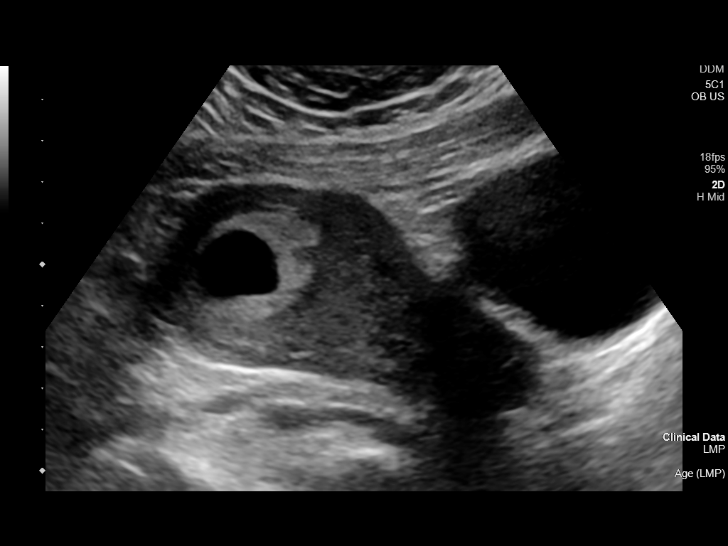
[im 27/66]
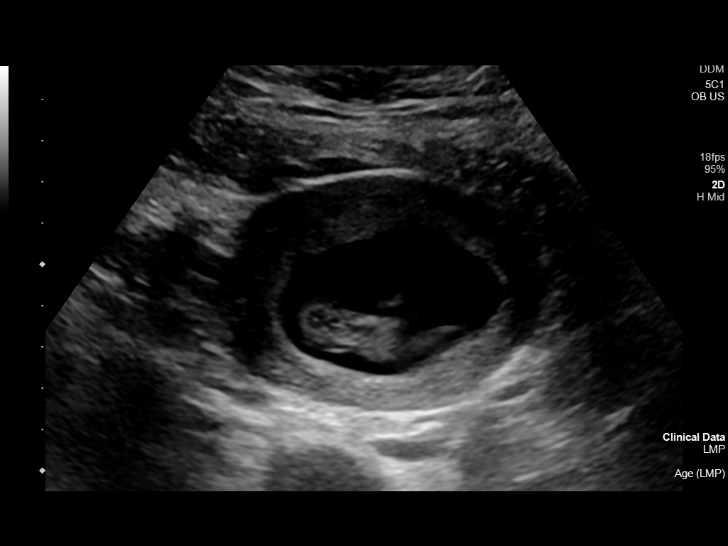
[im 32/66]
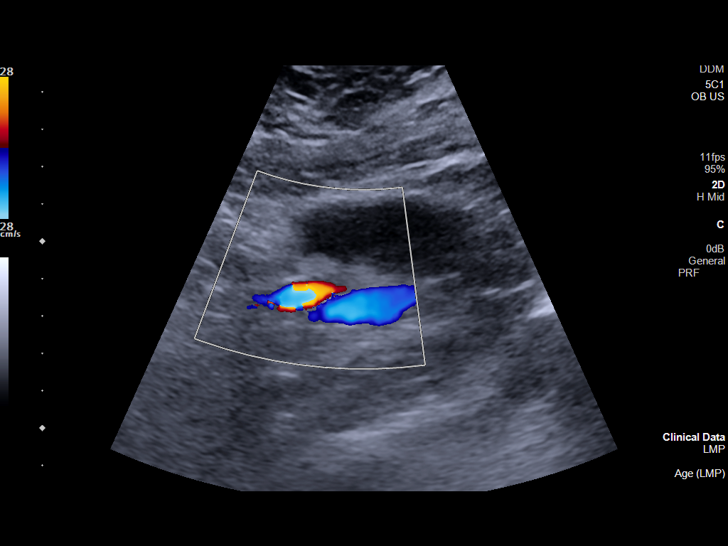
[im 37/66]
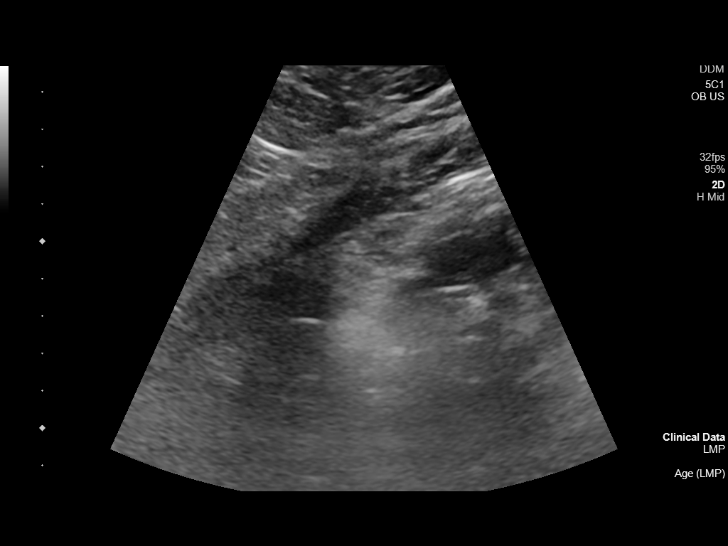
[im 41/66]
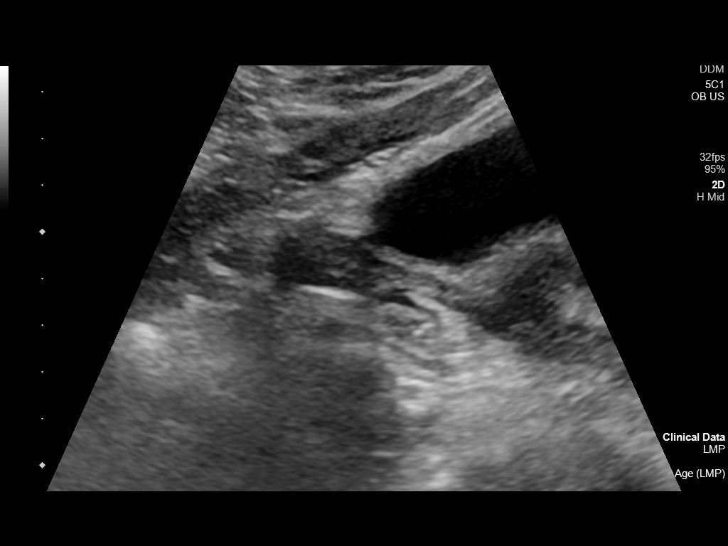
[im 46/66]
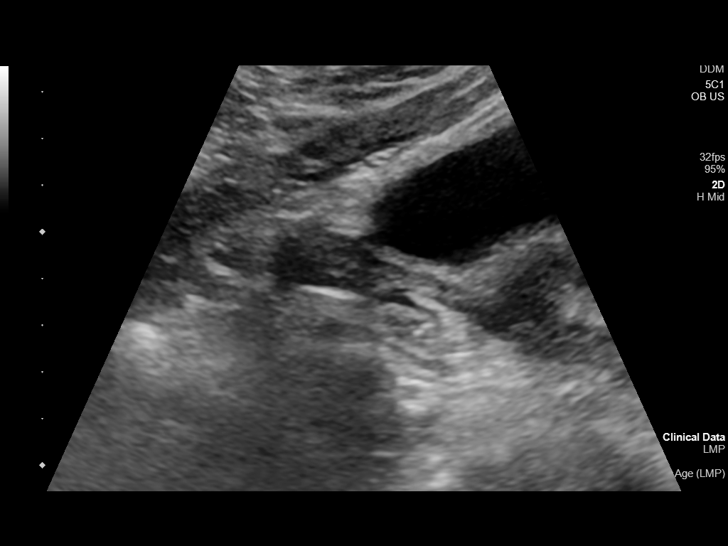
[im 51/66]
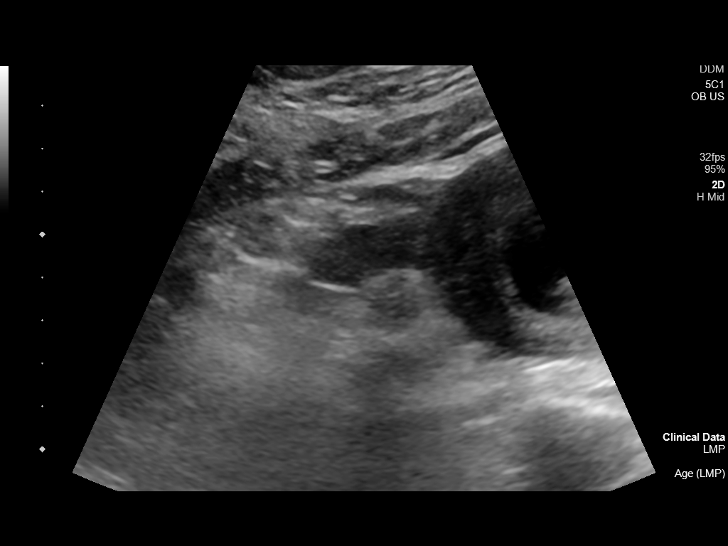
[im 56/66]
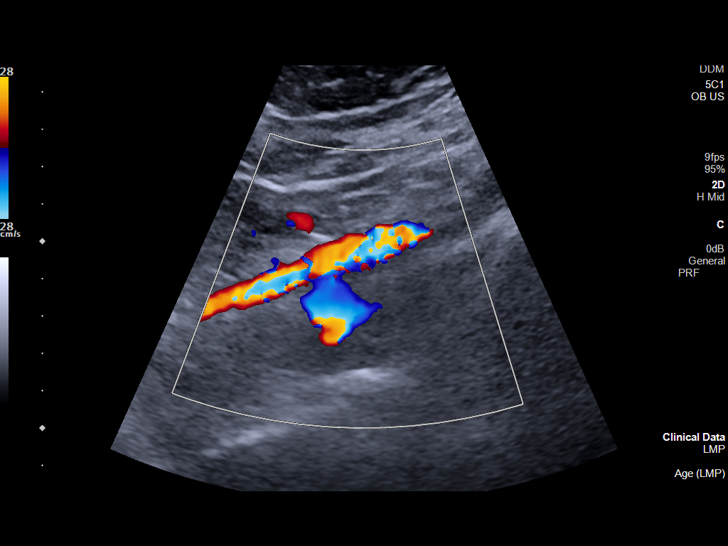
[im 61/66]
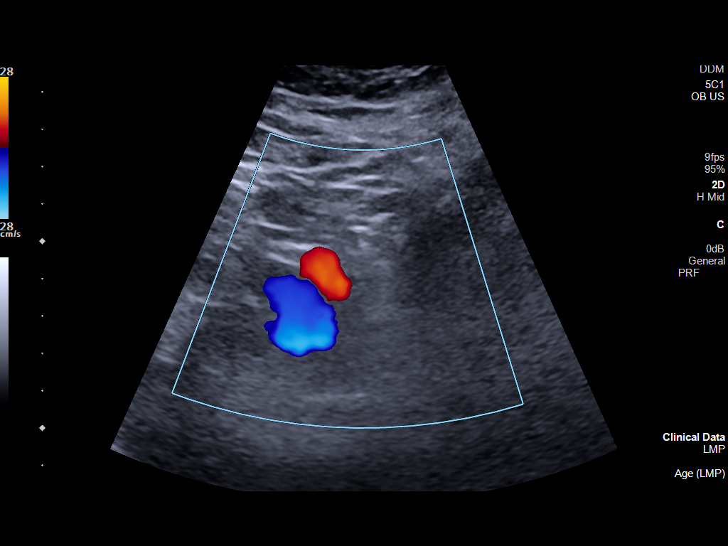
[im 66/66]
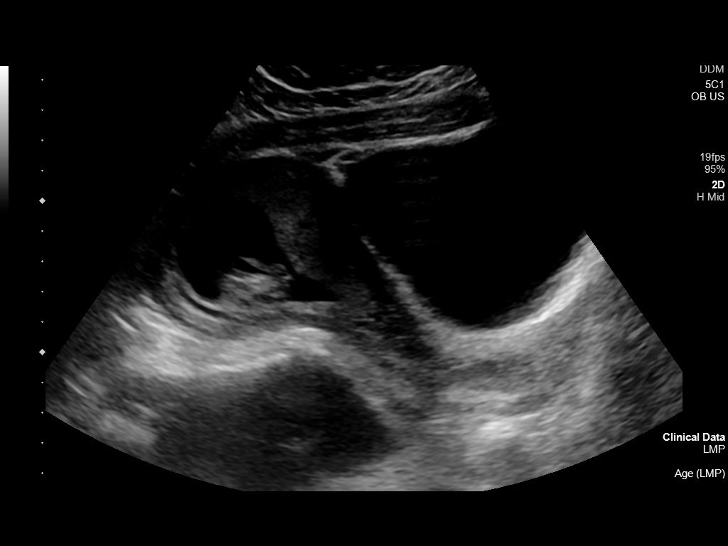

[14 of 28 positions shown; findings below may reference images not displayed]

FINDINGS: Intrauterine gestational sac: Single

Yolk sac:  Present

Embryo:  Present

Cardiac Activity: Present

Heart Rate: 164 bpm

CRL:   37 mm   10 w 4 d                  US EDC: 08/26/2021

Subchorionic hemorrhage:  None visualized.

Maternal uterus/adnexae: Unremarkable.
IMPRESSION: Single viable intrauterine pregnancy at 10 weeks 4 days.
